# Patient Record
Sex: Female | Born: 1945 | Race: White | Hispanic: No | Marital: Married | Smoking: Former smoker
Health system: Southern US, Community
[De-identification: ages and names within clinical notes are randomized; demographics above are authoritative.]

## PROBLEM LIST (undated history)

## (undated) DIAGNOSIS — C439 Malignant melanoma of skin, unspecified: Secondary | ICD-10-CM

## (undated) DIAGNOSIS — M8580 Other specified disorders of bone density and structure, unspecified site: Secondary | ICD-10-CM

## (undated) DIAGNOSIS — G4700 Insomnia, unspecified: Secondary | ICD-10-CM

## (undated) DIAGNOSIS — M329 Systemic lupus erythematosus, unspecified: Secondary | ICD-10-CM

## (undated) DIAGNOSIS — Z8719 Personal history of other diseases of the digestive system: Secondary | ICD-10-CM

## (undated) DIAGNOSIS — Z9889 Other specified postprocedural states: Secondary | ICD-10-CM

## (undated) DIAGNOSIS — K219 Gastro-esophageal reflux disease without esophagitis: Secondary | ICD-10-CM

## (undated) DIAGNOSIS — M1990 Unspecified osteoarthritis, unspecified site: Secondary | ICD-10-CM

## (undated) DIAGNOSIS — I341 Nonrheumatic mitral (valve) prolapse: Secondary | ICD-10-CM

## (undated) DIAGNOSIS — E119 Type 2 diabetes mellitus without complications: Secondary | ICD-10-CM

## (undated) DIAGNOSIS — R112 Nausea with vomiting, unspecified: Secondary | ICD-10-CM

## (undated) DIAGNOSIS — I10 Essential (primary) hypertension: Secondary | ICD-10-CM

## (undated) DIAGNOSIS — Z8489 Family history of other specified conditions: Secondary | ICD-10-CM

## (undated) HISTORY — PX: ABDOMINAL HYSTERECTOMY: SHX81

## (undated) HISTORY — PX: TOTAL SHOULDER ARTHROPLASTY: SHX126

## (undated) HISTORY — DX: Insomnia, unspecified: G47.00

## (undated) HISTORY — DX: Other specified disorders of bone density and structure, unspecified site: M85.80

## (undated) HISTORY — PX: OTHER SURGICAL HISTORY: SHX169

## (undated) HISTORY — DX: Malignant melanoma of skin, unspecified: C43.9

## (undated) HISTORY — DX: Systemic lupus erythematosus, unspecified: M32.9

## (undated) HISTORY — PX: BREAST SURGERY: SHX581

---

## 1998-12-06 ENCOUNTER — Encounter: Payer: Self-pay | Admitting: Sports Medicine

## 1998-12-06 ENCOUNTER — Ambulatory Visit (HOSPITAL_COMMUNITY): Admission: RE | Admit: 1998-12-06 | Discharge: 1998-12-06 | Payer: Self-pay | Admitting: Sports Medicine

## 2015-03-13 DIAGNOSIS — G4700 Insomnia, unspecified: Secondary | ICD-10-CM | POA: Insufficient documentation

## 2015-03-13 DIAGNOSIS — E119 Type 2 diabetes mellitus without complications: Secondary | ICD-10-CM | POA: Insufficient documentation

## 2015-03-13 DIAGNOSIS — F329 Major depressive disorder, single episode, unspecified: Secondary | ICD-10-CM | POA: Insufficient documentation

## 2015-04-02 DIAGNOSIS — F3342 Major depressive disorder, recurrent, in full remission: Secondary | ICD-10-CM | POA: Insufficient documentation

## 2015-11-07 NOTE — H&P (Signed)
TOTAL KNEE ADMISSION H&P  Patient is being admitted for right total knee arthroplasty.  Subjective:  Chief Complaint:      Right knee primary OA / pain  HPI: Maria Brennan, 70 y.o. female, has a history of pain and functional disability in the right knee due to arthritis and has failed non-surgical conservative treatments for greater than 12 weeks to includeNSAID's and/or analgesics, corticosteriod injections and activity modification.  Onset of symptoms was gradual, starting 2-3 years ago with gradually worsening course since that time. The patient noted no past surgery on the right knee(s).  Patient currently rates pain in the right knee(s) at 8 out of 10 with activity. Patient has worsening of pain with activity and weight bearing, pain that interferes with activities of daily living, pain with passive range of motion, crepitus and joint swelling.  Patient has evidence of periarticular osteophytes and joint space narrowing by imaging studies.  There is no active infection.   Risks, benefits and expectations were discussed with the patient.  Risks including but not limited to the risk of anesthesia, blood clots, nerve damage, blood vessel damage, failure of the prosthesis, infection and up to and including death.  Patient understand the risks, benefits and expectations and wishes to proceed with surgery.   PCP: Maria Channel, MD  D/C Plans:      Home  Post-op Meds:       No Rx given  Tranexamic Acid:      To be given - IV   Decadron:      Is to be given  FYI:     ASA  Norco      Past Medical History:  Diagnosis Date  . Arthritis    oa  . Diabetes mellitus without complication (Maria Brennan)   . Family history of adverse reaction to anesthesia    mother and sister slow to awaken   . GERD (gastroesophageal reflux disease)   . History of hiatal hernia   . Hypertension   . MVP (mitral valve prolapse)   . PONV (postoperative nausea and vomiting) yrs ago    Past Surgical History:  Procedure  Laterality Date  . ABDOMINAL HYSTERECTOMY     partial  . BREAST SURGERY Bilateral    implants  . rotator  cuff Right     No prescriptions prior to admission.   Allergies  Allergen Reactions  . Codeine Itching and Nausea Only    Social History  Substance Use Topics  . Smoking status: Current Every Day Smoker    Packs/day: 0.50    Years: 53.00    Types: Cigarettes  . Smokeless tobacco: Never Used  . Alcohol use No       Review of Systems  Constitutional: Positive for malaise/fatigue.  HENT: Positive for tinnitus.   Eyes: Negative.   Respiratory: Negative.   Cardiovascular: Negative.   Gastrointestinal: Positive for constipation and heartburn.  Genitourinary: Positive for frequency and urgency.  Musculoskeletal: Positive for back pain and joint pain.  Skin: Negative.   Neurological: Negative.   Endo/Heme/Allergies: Negative.   Psychiatric/Behavioral: The patient is nervous/anxious and has insomnia.     Objective:  Physical Exam  Constitutional: She is oriented to person, place, and time. She appears well-developed.  HENT:  Head: Normocephalic.  Eyes: Pupils are equal, round, and reactive to light.  Neck: Neck supple. No JVD present. No tracheal deviation present. No thyromegaly present.  Cardiovascular: Normal rate, regular rhythm, normal heart sounds and intact distal pulses.   Respiratory: Effort normal  and breath sounds normal. No respiratory distress. She has no wheezes.  GI: Soft. There is no tenderness. There is no guarding.  Musculoskeletal:       Right knee: She exhibits decreased range of motion, swelling and bony tenderness. She exhibits no ecchymosis, no deformity, no laceration, no erythema and normal alignment. Tenderness found.  Lymphadenopathy:    She has no cervical adenopathy.  Neurological: She is alert and oriented to person, place, and time.  Skin: Skin is warm and dry.  Psychiatric: She has a normal mood and affect.      Imaging  Review Plain radiographs demonstrate severe degenerative joint disease of the right knee(s). The overall alignment is neutral. The bone quality appears to be good for age and reported activity level.  Assessment/Plan:  End stage arthritis, right knee   The patient history, physical examination, clinical judgment of the provider and imaging studies are consistent with end stage degenerative joint disease of the right knee(s) and total knee arthroplasty is deemed medically necessary. The treatment options including medical management, injection therapy arthroscopy and arthroplasty were discussed at length. The risks and benefits of total knee arthroplasty were presented and reviewed. The risks due to aseptic loosening, infection, stiffness, patella tracking problems, thromboembolic complications and other imponderables were discussed. The patient acknowledged the explanation, agreed to proceed with the plan and consent was signed. Patient is being admitted for inpatient treatment for surgery, pain control, PT, OT, prophylactic antibiotics, VTE prophylaxis, progressive ambulation and ADL's and discharge planning. The patient is planning to be discharged home.      Maria Pugh Javious Hallisey   PA-C  11/15/2015, 2:40 PM

## 2015-11-08 ENCOUNTER — Other Ambulatory Visit (HOSPITAL_COMMUNITY): Payer: Self-pay | Admitting: *Deleted

## 2015-11-08 NOTE — Patient Instructions (Addendum)
Maria Brennan  11/08/2015   Your procedure is scheduled on: 11-20-15  Report to [facility] Main  Entrance take [facility]  elevators to 3rd floor to  [place] at 705 AM.  Call this number if you have problems the morning of surgery [phone]   Remember: ONLY 1 PERSON MAY GO WITH YOU TO SHORT STAY TO GET  READY MORNING OF [place].  Do not eat food or drink liquids :After Midnight.     Take these medicines the morning of surgery with A SIP OF WATER: duloxetine (cymbalta) DO NOT TAKE ANY DIABETIC MEDICATIONS DAY OF YOUR SURGERY                               You may not have any metal on your body including hair pins and              piercings  Do not wear jewelry, make-up, lotions, powders or perfumes, deodorant             Do not wear nail polish.  Do not shave  48 hours prior to surgery.              Men may shave face and neck.   Do not bring valuables to the hospital. [place].  Contacts, dentures or bridgework may not be worn into surgery.  Leave suitcase in the car. After surgery it may be brought to your room.               Please read over the following fact sheets you were given: _____________________________________________________________________             [facility] - Preparing for Surgery Before surgery, you can play an important role.  Because skin is not sterile, your skin needs to be as free of germs as possible.  You can reduce the number of germs on your skin by washing with CHG (chlorahexidine gluconate) soap before surgery.  CHG is an antiseptic cleaner which kills germs and bonds with the skin to continue killing germs even after washing. Please DO NOT use if you have an allergy to CHG or antibacterial soaps.  If your skin becomes reddened/irritated stop using the CHG and inform your nurse when you arrive at Short Stay. Do not shave (including legs and underarms) for at least 48  hours prior to the first CHG shower.  You may shave your face/neck. Please follow these instructions carefully:  1.  Shower with CHG Soap the night before surgery and the  morning of Surgery.  2.  If you choose to wash your hair, wash your hair first as usual with your  normal  shampoo.  3.  After you shampoo, rinse your hair and body thoroughly to remove the  shampoo.                           4.  Use CHG as you would any other liquid soap.  You can apply chg directly  to the skin and wash                       Gently with a scrungie or clean washcloth.  5.  Apply the CHG Soap to your body ONLY FROM THE NECK DOWN.   Do not use on face/ open                           Wound or open sores. Avoid contact with eyes, ears mouth and genitals (private parts).                       Wash face,  Genitals (private parts) with your normal soap.             6.  Wash thoroughly, paying special attention to the area where your surgery  will be performed.  7.  Thoroughly rinse your body with warm water from the neck down.  8.  DO NOT shower/wash with your normal soap after using and rinsing off  the CHG Soap.                9.  Pat yourself dry with a clean towel.            10.  Wear clean pajamas.            11.  Place clean sheets on your bed the night of your first shower and do not  sleep with pets. Day of Surgery : Do not apply any lotions/deodorants the morning of surgery.  Please wear clean clothes to the hospital/surgery center.  FAILURE TO FOLLOW THESE INSTRUCTIONS MAY RESULT IN THE CANCELLATION OF YOUR SURGERY PATIENT SIGNATURE_________________________________  NURSE SIGNATURE__________________________________  ________________________________________________________________________   Maria Brennan  An incentive spirometer is a tool that can help keep your lungs clear and active. This tool measures how well you are filling your lungs with each breath. Taking long deep breaths may help  reverse or decrease the chance of developing breathing (pulmonary) problems (especially infection) following:  A long period of time when you are unable to move or be active. BEFORE THE PROCEDURE   If the spirometer includes an indicator to show your best effort, your nurse or respiratory therapist will set it to a desired goal.  If possible, sit up straight or lean slightly forward. Try not to slouch.  Hold the incentive spirometer in an upright position. INSTRUCTIONS FOR USE  1. Sit on the edge of your bed if possible, or sit up as far as you can in bed or on a chair. 2. Hold the incentive spirometer in an upright position. 3. Breathe out normally. 4. Place the mouthpiece in your mouth and seal your lips tightly around it. 5. Breathe in slowly and as deeply as possible, raising the piston or the ball toward the top of the column. 6. Hold your breath for 3-5 seconds or for as long as possible. Allow the piston or ball to fall to the bottom of the column. 7. Remove the mouthpiece from your mouth and breathe out normally. 8. Rest for a few seconds and repeat Steps 1 through 7 at least 10 times every 1-2 hours when you are awake. Take your time and take a few normal breaths between deep breaths. 9. The spirometer may include an indicator to show your best effort. Use the indicator as a goal to work toward during each repetition. 10. After each set of 10 deep breaths, practice coughing to be sure your lungs are clear. If you have an incision (the cut made at the time of surgery), support your incision when coughing by placing a  pillow or rolled up towels firmly against it. Once you are able to get out of bed, walk around indoors and cough well. You may stop using the incentive spirometer when instructed by your caregiver.  RISKS AND COMPLICATIONS  Take your time so you do not get dizzy or light-headed.  If you are in pain, you may need to take or ask for pain medication before doing incentive  spirometry. It is harder to take a deep breath if you are having pain. AFTER USE  Rest and breathe slowly and easily.  It can be helpful to keep track of a log of your progress. Your caregiver can provide you with a simple table to help with this. If you are using the spirometer at home, follow these instructions: [place] IF:   You are having difficultly using the spirometer.  You have trouble using the spirometer as often as instructed.  Your pain medication is not giving enough relief while using the spirometer.  You develop fever of 100.5 F (38.1 C) or higher. SEEK IMMEDIATE MEDICAL CARE IF:   You cough up bloody sputum that had not been present before.  You develop fever of 102 F (38.9 C) or greater.  You develop worsening pain at or near the incision site. MAKE SURE YOU:   Understand these instructions.  Will watch your condition.  Will get help right away if you are not doing well or get worse. Document Released: 07/07/2006 Document Revised: 05/19/2011 Document Reviewed: 09/07/2006 Exit[employer] Patient Information 2014 [employer], [place].   ________________________________________________________________________  WHAT IS A BLOOD TRANSFUSION? Blood Transfusion Information  A transfusion is the replacement of blood or some of its parts. Blood is made up of multiple cells which provide different functions.  Red blood cells carry oxygen and are used for blood loss replacement.  White blood cells fight against infection.  Platelets control bleeding.  Plasma helps clot blood.  Other blood products are available for specialized needs, such as hemophilia or other clotting disorders. BEFORE THE TRANSFUSION  Who gives blood for transfusions?   Healthy volunteers who are fully evaluated to make sure their blood is safe. This is blood bank blood. Transfusion therapy is the safest it has ever been in the practice of medicine. Before blood is taken from a donor, a  complete history is taken to make sure that person has no history of diseases nor engages in risky social behavior (examples are intravenous drug use or sexual activity with multiple partners). The donor's travel history is screened to minimize risk of transmitting infections, such as malaria. The donated blood is tested for signs of infectious diseases, such as HIV and hepatitis. The blood is then tested to be sure it is compatible with you in order to minimize the chance of a transfusion reaction. If you or a relative donates blood, this is often done in anticipation of surgery and is not appropriate for emergency situations. It takes many days to process the donated blood. RISKS AND COMPLICATIONS Although transfusion therapy is very safe and saves many lives, the main dangers of transfusion include:   Getting an infectious disease.  Developing a transfusion reaction. This is an allergic reaction to something in the blood you were given. Every precaution is taken to prevent this. The decision to have a blood transfusion has been considered carefully by your caregiver before blood is given. Blood is not given unless the benefits outweigh the risks. AFTER THE TRANSFUSION  Right after receiving a blood transfusion, you  will usually feel much better and more energetic. This is especially true if your red blood cells have gotten low (anemic). The transfusion raises the level of the red blood cells which carry oxygen, and this usually causes an energy increase.  The nurse administering the transfusion will monitor you carefully for complications. HOME CARE INSTRUCTIONS  No special instructions are needed after a transfusion. You may find your energy is better. Speak with your caregiver about any limitations on activity for underlying diseases you may have. SEEK MEDICAL CARE IF:   Your condition is not improving after your transfusion.  You develop redness or irritation at the intravenous (IV)  site. SEEK IMMEDIATE MEDICAL CARE IF:  Any of the following symptoms occur over the next 12 hours:  Shaking chills.  You have a temperature by mouth above 102 F (38.9 C), not controlled by medicine.  Chest, back, or muscle pain.  People around you feel you are not acting correctly or are confused.  Shortness of breath or difficulty breathing.  Dizziness and fainting.  You get a rash or develop hives.  You have a decrease in urine output.  Your urine turns a dark color or changes to pink, red, or brown. Any of the following symptoms occur over the next 10 days:  You have a temperature by mouth above 102 F (38.9 C), not controlled by medicine.  Shortness of breath.  Weakness after normal activity.  The white part of the eye turns yellow (jaundice).  You have a decrease in the amount of urine or are urinating less often.  Your urine turns a dark color or changes to pink, red, or brown. Document Released: 02/22/2000 Document Revised: 05/19/2011 Document Reviewed: 10/11/2007 [facility] Patient Information 2014 [place], [place].  _______________________________________________________________________

## 2015-11-08 NOTE — Progress Notes (Signed)
ekg 10-21-12 dr Raeanne Gathers on chart  medical clearance dr Raeanne Gathers on chart for 11-20-15 surgeeyr

## 2015-11-09 ENCOUNTER — Encounter (HOSPITAL_COMMUNITY): Payer: Self-pay | Admitting: *Deleted

## 2015-11-09 ENCOUNTER — Encounter (HOSPITAL_COMMUNITY)
Admission: RE | Admit: 2015-11-09 | Discharge: 2015-11-09 | Disposition: A | Discharge status: Home / Self Care | Payer: Medicare Other | Source: Ambulatory Visit | Attending: Orthopedic Surgery | Admitting: Orthopedic Surgery

## 2015-11-09 DIAGNOSIS — Z01812 Encounter for preprocedural laboratory examination: Secondary | ICD-10-CM | POA: Insufficient documentation

## 2015-11-09 HISTORY — DX: Other specified postprocedural states: Z98.890

## 2015-11-09 HISTORY — DX: Personal history of other diseases of the digestive system: Z87.19

## 2015-11-09 HISTORY — DX: Family history of other specified conditions: Z84.89

## 2015-11-09 HISTORY — DX: Gastro-esophageal reflux disease without esophagitis: K21.9

## 2015-11-09 HISTORY — DX: Nonrheumatic mitral (valve) prolapse: I34.1

## 2015-11-09 HISTORY — DX: Type 2 diabetes mellitus without complications: E11.9

## 2015-11-09 HISTORY — DX: Essential (primary) hypertension: I10

## 2015-11-09 HISTORY — DX: Other specified postprocedural states: R11.2

## 2015-11-09 HISTORY — DX: Unspecified osteoarthritis, unspecified site: M19.90

## 2015-11-09 LAB — BASIC METABOLIC PANEL
Anion gap: 7 (ref 5–15)
BUN: 14 mg/dL (ref 6–20)
CALCIUM: 9.6 mg/dL (ref 8.9–10.3)
CHLORIDE: 105 mmol/L (ref 101–111)
CO2: 26 mmol/L (ref 22–32)
CREATININE: 0.85 mg/dL (ref 0.44–1.00)
Glucose, Bld: 81 mg/dL (ref 65–99)
Potassium: 4.1 mmol/L (ref 3.5–5.1)
SODIUM: 138 mmol/L (ref 135–145)

## 2015-11-09 LAB — CBC
HCT: 35.1 % — ABNORMAL LOW (ref 36.0–46.0)
Hemoglobin: 11.7 g/dL — ABNORMAL LOW (ref 12.0–15.0)
MCH: 30.8 pg (ref 26.0–34.0)
MCHC: 33.3 g/dL (ref 30.0–36.0)
MCV: 92.4 fL (ref 78.0–100.0)
PLATELETS: 324 10*3/uL (ref 150–400)
RBC: 3.8 MIL/uL — AB (ref 3.87–5.11)
RDW: 13.2 % (ref 11.5–15.5)
WBC: 4.5 10*3/uL (ref 4.0–10.5)

## 2015-11-09 LAB — SURGICAL PCR SCREEN
MRSA, PCR: NEGATIVE
STAPHYLOCOCCUS AUREUS: NEGATIVE

## 2015-11-10 LAB — HEMOGLOBIN A1C
Hgb A1c MFr Bld: 6 % — ABNORMAL HIGH (ref 4.8–5.6)
MEAN PLASMA GLUCOSE: 126 mg/dL

## 2015-11-14 ENCOUNTER — Other Ambulatory Visit (HOSPITAL_COMMUNITY): Payer: Self-pay

## 2015-11-19 NOTE — Anesthesia Preprocedure Evaluation (Addendum)
Anesthesia Evaluation  Patient identified by MRN, date of birth, ID band Patient awake    Reviewed: Allergy & Precautions, NPO status , Patient's Chart, lab work & pertinent test results  History of Anesthesia Complications (+) PONV, Family history of anesthesia reaction and history of anesthetic complications (mother and sister slow to awaken)  Airway Mallampati: II  TM Distance: >3 FB Neck ROM: Full    Dental  (+) Dental Advisory Given,    Pulmonary neg shortness of breath, sleep apnea (symptoms suggestive) , neg COPD, Current Smoker,    Pulmonary exam normal breath sounds clear to auscultation       Cardiovascular hypertension, Pt. on medications (-) angina(-) Past MI, (-) Cardiac Stents and (-) CABG + Valvular Problems/Murmurs MVP  Rhythm:Regular Rate:Normal     Neuro/Psych neg Seizures negative neurological ROS     GI/Hepatic Neg liver ROS, hiatal hernia, GERD  Medicated and Controlled,  Endo/Other  diabetes, Well Controlled, Type 2, Oral Hypoglycemic Agents  Renal/GU negative Renal ROS     Musculoskeletal  (+) Arthritis ,   Abdominal   Peds  Hematology negative hematology ROS (+)   Anesthesia Other Findings HLD  Reproductive/Obstetrics                            Anesthesia Physical Anesthesia Plan  ASA: III  Anesthesia Plan: Spinal   Post-op Pain Management:    Induction: Intravenous  Airway Management Planned: Natural Airway and Simple Face Mask  Additional Equipment:   Intra-op Plan:   Post-operative Plan: Extubation in OR  Informed Consent: I have reviewed the patients History and Physical, chart, labs and discussed the procedure including the risks, benefits and alternatives for the proposed anesthesia with the patient or authorized representative who has indicated his/her understanding and acceptance.   Dental advisory given  Plan Discussed with:  CRNA  Anesthesia Plan Comments: (I have discussed risks of neuraxial anesthesia including but not limited to infection, bleeding, nerve injury, back pain, headache, seizures, and failure of block. Patient denies bleeding disorders and is not currently anticoagulated. Labs have been reviewed. Risks and benefits discussed. All patient's questions answered.   Platelets 324)       Anesthesia Quick Evaluation

## 2015-11-20 ENCOUNTER — Inpatient Hospital Stay (HOSPITAL_COMMUNITY): Payer: Medicare Other | Admitting: Anesthesiology

## 2015-11-20 ENCOUNTER — Encounter (HOSPITAL_COMMUNITY)
Admission: RE | Disposition: A | Discharge status: Home Health | Payer: Self-pay | Source: Ambulatory Visit | Attending: Orthopedic Surgery

## 2015-11-20 ENCOUNTER — Inpatient Hospital Stay (HOSPITAL_COMMUNITY)
Admission: RE | Admit: 2015-11-20 | Discharge: 2015-11-21 | DRG: 470 | Disposition: A | Discharge status: Home Health | Payer: Medicare Other | Source: Ambulatory Visit | Attending: Orthopedic Surgery | Admitting: Orthopedic Surgery

## 2015-11-20 ENCOUNTER — Encounter (HOSPITAL_COMMUNITY): Payer: Self-pay | Admitting: *Deleted

## 2015-11-20 DIAGNOSIS — Z7982 Long term (current) use of aspirin: Secondary | ICD-10-CM

## 2015-11-20 DIAGNOSIS — I10 Essential (primary) hypertension: Secondary | ICD-10-CM | POA: Diagnosis not present

## 2015-11-20 DIAGNOSIS — K219 Gastro-esophageal reflux disease without esophagitis: Secondary | ICD-10-CM | POA: Diagnosis present

## 2015-11-20 DIAGNOSIS — M1711 Unilateral primary osteoarthritis, right knee: Principal | ICD-10-CM | POA: Diagnosis present

## 2015-11-20 DIAGNOSIS — F17210 Nicotine dependence, cigarettes, uncomplicated: Secondary | ICD-10-CM | POA: Diagnosis not present

## 2015-11-20 DIAGNOSIS — E663 Overweight: Secondary | ICD-10-CM | POA: Diagnosis present

## 2015-11-20 DIAGNOSIS — Z6826 Body mass index (BMI) 26.0-26.9, adult: Secondary | ICD-10-CM

## 2015-11-20 DIAGNOSIS — Z96651 Presence of right artificial knee joint: Secondary | ICD-10-CM

## 2015-11-20 DIAGNOSIS — E119 Type 2 diabetes mellitus without complications: Secondary | ICD-10-CM | POA: Diagnosis present

## 2015-11-20 DIAGNOSIS — M659 Synovitis and tenosynovitis, unspecified: Secondary | ICD-10-CM | POA: Diagnosis present

## 2015-11-20 DIAGNOSIS — M25561 Pain in right knee: Secondary | ICD-10-CM | POA: Diagnosis present

## 2015-11-20 DIAGNOSIS — K449 Diaphragmatic hernia without obstruction or gangrene: Secondary | ICD-10-CM | POA: Diagnosis not present

## 2015-11-20 DIAGNOSIS — Z96659 Presence of unspecified artificial knee joint: Secondary | ICD-10-CM

## 2015-11-20 HISTORY — PX: TOTAL KNEE ARTHROPLASTY: SHX125

## 2015-11-20 LAB — GLUCOSE, CAPILLARY
GLUCOSE-CAPILLARY: 116 mg/dL — AB (ref 65–99)
GLUCOSE-CAPILLARY: 205 mg/dL — AB (ref 65–99)
Glucose-Capillary: 114 mg/dL — ABNORMAL HIGH (ref 65–99)
Glucose-Capillary: 146 mg/dL — ABNORMAL HIGH (ref 65–99)

## 2015-11-20 LAB — ABO/RH: ABO/RH(D): A POS

## 2015-11-20 LAB — TYPE AND SCREEN
ABO/RH(D): A POS
Antibody Screen: NEGATIVE

## 2015-11-20 SURGERY — ARTHROPLASTY, KNEE, TOTAL
Anesthesia: Spinal | Site: Knee | Laterality: Right

## 2015-11-20 MED ORDER — CELECOXIB 200 MG PO CAPS
200.0000 mg | ORAL_CAPSULE | Freq: Two times a day (BID) | ORAL | Status: DC
  Administered 2015-11-20 – 2015-11-21 (×2): 200 mg via ORAL
  Filled 2015-11-20 (×2): qty 1

## 2015-11-20 MED ORDER — DULOXETINE HCL 30 MG PO CPEP
30.0000 mg | ORAL_CAPSULE | Freq: Two times a day (BID) | ORAL | Status: DC
  Administered 2015-11-20 – 2015-11-21 (×2): 30 mg via ORAL
  Filled 2015-11-20 (×2): qty 1

## 2015-11-20 MED ORDER — PROPOFOL 10 MG/ML IV BOLUS
INTRAVENOUS | Status: AC
  Filled 2015-11-20: qty 20

## 2015-11-20 MED ORDER — DIPHENHYDRAMINE HCL 25 MG PO CAPS: 25.0000 mg | ORAL_CAPSULE | Freq: Four times a day (QID) | ORAL | Status: DC | PRN

## 2015-11-20 MED ORDER — METFORMIN HCL 500 MG PO TABS
500.0000 mg | ORAL_TABLET | Freq: Every day | ORAL | Status: DC
  Administered 2015-11-20: 500 mg via ORAL
  Filled 2015-11-20: qty 1

## 2015-11-20 MED ORDER — MIDAZOLAM HCL 5 MG/5ML IJ SOLN
INTRAMUSCULAR | Status: DC | PRN
  Administered 2015-11-20: 2 mg via INTRAVENOUS

## 2015-11-20 MED ORDER — CEFAZOLIN SODIUM-DEXTROSE 2-4 GM/100ML-% IV SOLN
2.0000 g | INTRAVENOUS | Status: AC
  Administered 2015-11-20: 2 g via INTRAVENOUS
  Filled 2015-11-20: qty 100

## 2015-11-20 MED ORDER — BUPIVACAINE HCL 0.25 % IJ SOLN
INTRAMUSCULAR | Status: DC | PRN
  Administered 2015-11-20: 30 mL

## 2015-11-20 MED ORDER — HYDROMORPHONE HCL 1 MG/ML IJ SOLN
0.5000 mg | INTRAMUSCULAR | Status: DC | PRN
  Administered 2015-11-20: 0.5 mg via INTRAVENOUS
  Administered 2015-11-20: 1 mg via INTRAVENOUS
  Filled 2015-11-20 (×2): qty 1

## 2015-11-20 MED ORDER — DEXAMETHASONE SODIUM PHOSPHATE 10 MG/ML IJ SOLN
10.0000 mg | Freq: Once | INTRAMUSCULAR | Status: DC
  Filled 2015-11-20: qty 1

## 2015-11-20 MED ORDER — HYDROXYZINE HCL 25 MG PO TABS
25.0000 mg | ORAL_TABLET | Freq: Every day | ORAL | Status: DC
  Administered 2015-11-20: 25 mg via ORAL
  Filled 2015-11-20: qty 1

## 2015-11-20 MED ORDER — BUPIVACAINE HCL (PF) 0.25 % IJ SOLN
INTRAMUSCULAR | Status: AC
  Filled 2015-11-20: qty 30

## 2015-11-20 MED ORDER — PHENYLEPHRINE HCL 10 MG/ML IJ SOLN
INTRAMUSCULAR | Status: DC | PRN
  Administered 2015-11-20: 10 ug/min via INTRAVENOUS

## 2015-11-20 MED ORDER — POLYETHYLENE GLYCOL 3350 17 G PO PACK
17.0000 g | PACK | Freq: Two times a day (BID) | ORAL | Status: DC
  Administered 2015-11-20 – 2015-11-21 (×2): 17 g via ORAL
  Filled 2015-11-20 (×2): qty 1

## 2015-11-20 MED ORDER — ASPIRIN 81 MG PO CHEW: 81.0000 mg | CHEWABLE_TABLET | Freq: Two times a day (BID) | ORAL | 0 refills | Status: DC

## 2015-11-20 MED ORDER — CEFAZOLIN SODIUM-DEXTROSE 2-4 GM/100ML-% IV SOLN
INTRAVENOUS | Status: AC
  Filled 2015-11-20: qty 100

## 2015-11-20 MED ORDER — LIRAGLUTIDE 18 MG/3ML ~~LOC~~ SOPN: 1.8000 mg | PEN_INJECTOR | Freq: Every day | SUBCUTANEOUS | Status: DC

## 2015-11-20 MED ORDER — DOCUSATE SODIUM 100 MG PO CAPS: 100.0000 mg | ORAL_CAPSULE | Freq: Two times a day (BID) | ORAL | 0 refills | Status: DC

## 2015-11-20 MED ORDER — TIZANIDINE HCL 4 MG PO TABS: 4.0000 mg | ORAL_TABLET | Freq: Four times a day (QID) | ORAL | 0 refills | Status: DC | PRN

## 2015-11-20 MED ORDER — HYDROCODONE-ACETAMINOPHEN 7.5-325 MG PO TABS: 1.0000 | ORAL_TABLET | ORAL | 0 refills | Status: DC | PRN

## 2015-11-20 MED ORDER — METOCLOPRAMIDE HCL 5 MG PO TABS: 5.0000 mg | ORAL_TABLET | Freq: Three times a day (TID) | ORAL | Status: DC | PRN

## 2015-11-20 MED ORDER — PHENYLEPHRINE HCL 10 MG/ML IJ SOLN
INTRAMUSCULAR | Status: AC
  Filled 2015-11-20: qty 1

## 2015-11-20 MED ORDER — ATORVASTATIN CALCIUM 20 MG PO TABS
40.0000 mg | ORAL_TABLET | Freq: Every evening | ORAL | Status: DC
  Administered 2015-11-20: 40 mg via ORAL
  Filled 2015-11-20: qty 2

## 2015-11-20 MED ORDER — BUPIVACAINE IN DEXTROSE 0.75-8.25 % IT SOLN
INTRATHECAL | Status: DC | PRN
  Administered 2015-11-20: 2 mL via INTRATHECAL

## 2015-11-20 MED ORDER — 0.9 % SODIUM CHLORIDE (POUR BTL) OPTIME
TOPICAL | Status: DC | PRN
  Administered 2015-11-20: 1000 mL

## 2015-11-20 MED ORDER — DEXAMETHASONE SODIUM PHOSPHATE 10 MG/ML IJ SOLN
10.0000 mg | Freq: Once | INTRAMUSCULAR | Status: AC
Start: 2015-11-20 — End: 2015-11-20
  Administered 2015-11-20: 10 mg via INTRAVENOUS

## 2015-11-20 MED ORDER — KETOROLAC TROMETHAMINE 30 MG/ML IJ SOLN
INTRAMUSCULAR | Status: DC | PRN
  Administered 2015-11-20: 30 mg

## 2015-11-20 MED ORDER — METHOCARBAMOL 1000 MG/10ML IJ SOLN
500.0000 mg | Freq: Four times a day (QID) | INTRAVENOUS | Status: DC | PRN
  Administered 2015-11-20: 500 mg via INTRAVENOUS
  Filled 2015-11-20: qty 5
  Filled 2015-11-20: qty 550

## 2015-11-20 MED ORDER — LACTATED RINGERS IV SOLN
INTRAVENOUS | Status: DC
  Administered 2015-11-20 (×2): via INTRAVENOUS

## 2015-11-20 MED ORDER — CELECOXIB 200 MG PO CAPS: 200.0000 mg | ORAL_CAPSULE | Freq: Two times a day (BID) | ORAL | 0 refills | Status: DC

## 2015-11-20 MED ORDER — SODIUM CHLORIDE 0.9 % IV SOLN
INTRAVENOUS | Status: DC
  Administered 2015-11-20 – 2015-11-21 (×2): via INTRAVENOUS
  Filled 2015-11-20 (×5): qty 1000

## 2015-11-20 MED ORDER — METOCLOPRAMIDE HCL 5 MG/ML IJ SOLN: 5.0000 mg | Freq: Three times a day (TID) | INTRAMUSCULAR | Status: DC | PRN

## 2015-11-20 MED ORDER — FENTANYL CITRATE (PF) 100 MCG/2ML IJ SOLN: 25.0000 ug | INTRAMUSCULAR | Status: DC | PRN

## 2015-11-20 MED ORDER — SODIUM CHLORIDE 0.9 % IJ SOLN
INTRAMUSCULAR | Status: DC | PRN
  Administered 2015-11-20: 30 mL

## 2015-11-20 MED ORDER — IRBESARTAN 150 MG PO TABS
150.0000 mg | ORAL_TABLET | Freq: Every day | ORAL | Status: DC
  Administered 2015-11-20 – 2015-11-21 (×2): 150 mg via ORAL
  Filled 2015-11-20 (×2): qty 1

## 2015-11-20 MED ORDER — MENTHOL 3 MG MT LOZG: 1.0000 | LOZENGE | OROMUCOSAL | Status: DC | PRN

## 2015-11-20 MED ORDER — LIDOCAINE 2% (20 MG/ML) 5 ML SYRINGE
INTRAMUSCULAR | Status: AC
  Filled 2015-11-20: qty 5

## 2015-11-20 MED ORDER — FERROUS SULFATE 325 (65 FE) MG PO TABS: 325.0000 mg | ORAL_TABLET | Freq: Three times a day (TID) | ORAL | Status: DC

## 2015-11-20 MED ORDER — PROPOFOL 10 MG/ML IV BOLUS
INTRAVENOUS | Status: AC
  Filled 2015-11-20: qty 40

## 2015-11-20 MED ORDER — TRANEXAMIC ACID 1000 MG/10ML IV SOLN
1000.0000 mg | INTRAVENOUS | Status: AC
  Administered 2015-11-20: 1000 mg via INTRAVENOUS
  Filled 2015-11-20: qty 1100

## 2015-11-20 MED ORDER — SODIUM CHLORIDE 0.9 % IR SOLN
Status: DC | PRN
  Administered 2015-11-20: 1000 mL

## 2015-11-20 MED ORDER — ONDANSETRON HCL 4 MG/2ML IJ SOLN
4.0000 mg | Freq: Four times a day (QID) | INTRAMUSCULAR | Status: DC | PRN
Start: 2015-11-20 — End: 2015-11-21

## 2015-11-20 MED ORDER — ONDANSETRON HCL 4 MG/2ML IJ SOLN
INTRAMUSCULAR | Status: AC
  Filled 2015-11-20: qty 2

## 2015-11-20 MED ORDER — MIDAZOLAM HCL 2 MG/2ML IJ SOLN
INTRAMUSCULAR | Status: AC
  Filled 2015-11-20: qty 2

## 2015-11-20 MED ORDER — PANTOPRAZOLE SODIUM 40 MG PO TBEC
80.0000 mg | DELAYED_RELEASE_TABLET | Freq: Every day | ORAL | Status: DC
  Administered 2015-11-20 – 2015-11-21 (×2): 80 mg via ORAL
  Filled 2015-11-20 (×2): qty 2

## 2015-11-20 MED ORDER — STERILE WATER FOR IRRIGATION IR SOLN
Status: DC | PRN
  Administered 2015-11-20: 3000 mL

## 2015-11-20 MED ORDER — PROPOFOL 500 MG/50ML IV EMUL
INTRAVENOUS | Status: DC | PRN
  Administered 2015-11-20: 75 ug/kg/min via INTRAVENOUS

## 2015-11-20 MED ORDER — ZOLPIDEM TARTRATE 5 MG PO TABS
5.0000 mg | ORAL_TABLET | Freq: Every day | ORAL | Status: DC
  Administered 2015-11-20: 5 mg via ORAL
  Filled 2015-11-20: qty 1

## 2015-11-20 MED ORDER — TRIAMTERENE-HCTZ 37.5-25 MG PO TABS
0.5000 | ORAL_TABLET | Freq: Every day | ORAL | Status: DC
  Administered 2015-11-20 – 2015-11-21 (×2): 0.5 via ORAL
  Filled 2015-11-20 (×2): qty 0.5

## 2015-11-20 MED ORDER — ONDANSETRON HCL 4 MG PO TABS: 4.0000 mg | ORAL_TABLET | Freq: Four times a day (QID) | ORAL | Status: DC | PRN

## 2015-11-20 MED ORDER — CEFAZOLIN SODIUM-DEXTROSE 2-4 GM/100ML-% IV SOLN
2.0000 g | Freq: Four times a day (QID) | INTRAVENOUS | Status: AC
  Administered 2015-11-20 (×2): 2 g via INTRAVENOUS
  Filled 2015-11-20 (×2): qty 100

## 2015-11-20 MED ORDER — PHENOL 1.4 % MT LIQD: 1.0000 | OROMUCOSAL | Status: DC | PRN

## 2015-11-20 MED ORDER — PROMETHAZINE HCL 25 MG/ML IJ SOLN: 6.2500 mg | INTRAMUSCULAR | Status: DC | PRN

## 2015-11-20 MED ORDER — FERROUS SULFATE 325 (65 FE) MG PO TABS: 325.0000 mg | ORAL_TABLET | Freq: Three times a day (TID) | ORAL | 3 refills | Status: DC

## 2015-11-20 MED ORDER — BISACODYL 10 MG RE SUPP: 10.0000 mg | Freq: Every day | RECTAL | Status: DC | PRN

## 2015-11-20 MED ORDER — DOCUSATE SODIUM 100 MG PO CAPS
100.0000 mg | ORAL_CAPSULE | Freq: Two times a day (BID) | ORAL | Status: DC
  Administered 2015-11-20: 100 mg via ORAL
  Filled 2015-11-20: qty 1

## 2015-11-20 MED ORDER — ONDANSETRON HCL 4 MG/2ML IJ SOLN
INTRAMUSCULAR | Status: DC | PRN
  Administered 2015-11-20: 4 mg via INTRAVENOUS

## 2015-11-20 MED ORDER — HYDROCODONE-ACETAMINOPHEN 7.5-325 MG PO TABS
1.0000 | ORAL_TABLET | ORAL | Status: DC
  Administered 2015-11-20: 1 via ORAL
  Administered 2015-11-20 – 2015-11-21 (×5): 2 via ORAL
  Filled 2015-11-20: qty 2
  Filled 2015-11-20: qty 1
  Filled 2015-11-20 (×4): qty 2

## 2015-11-20 MED ORDER — KETOROLAC TROMETHAMINE 30 MG/ML IJ SOLN
INTRAMUSCULAR | Status: AC
  Filled 2015-11-20: qty 1

## 2015-11-20 MED ORDER — METHOCARBAMOL 500 MG PO TABS
500.0000 mg | ORAL_TABLET | Freq: Four times a day (QID) | ORAL | Status: DC | PRN
  Administered 2015-11-20 – 2015-11-21 (×2): 500 mg via ORAL
  Filled 2015-11-20 (×2): qty 1

## 2015-11-20 MED ORDER — SODIUM CHLORIDE 0.9 % IJ SOLN
INTRAMUSCULAR | Status: AC
  Filled 2015-11-20: qty 50

## 2015-11-20 MED ORDER — ALUM & MAG HYDROXIDE-SIMETH 200-200-20 MG/5ML PO SUSP: 30.0000 mL | ORAL | Status: DC | PRN

## 2015-11-20 MED ORDER — ASPIRIN 81 MG PO CHEW
81.0000 mg | CHEWABLE_TABLET | Freq: Two times a day (BID) | ORAL | Status: DC
  Administered 2015-11-20 – 2015-11-21 (×2): 81 mg via ORAL
  Filled 2015-11-20 (×2): qty 1

## 2015-11-20 MED ORDER — MAGNESIUM CITRATE PO SOLN: 1.0000 | Freq: Once | ORAL | Status: DC | PRN

## 2015-11-20 MED ORDER — POLYETHYLENE GLYCOL 3350 17 G PO PACK: 17.0000 g | PACK | Freq: Two times a day (BID) | ORAL | 0 refills | Status: DC

## 2015-11-20 MED ORDER — DEXAMETHASONE SODIUM PHOSPHATE 10 MG/ML IJ SOLN
INTRAMUSCULAR | Status: AC
  Filled 2015-11-20: qty 1

## 2015-11-20 SURGICAL SUPPLY — 47 items
BAG DECANTER FOR FLEXI CONT (MISCELLANEOUS) IMPLANT
BAG SPEC THK2 15X12 ZIP CLS (MISCELLANEOUS)
BAG ZIPLOCK 12X15 (MISCELLANEOUS) IMPLANT
BANDAGE ACE 6X5 VEL STRL LF (GAUZE/BANDAGES/DRESSINGS) ×3 IMPLANT
BLADE SAW SGTL 13.0X1.19X90.0M (BLADE) ×3 IMPLANT
BONE CEMENT GENTAMICIN (Cement) ×6 IMPLANT
BOWL SMART MIX CTS (DISPOSABLE) ×3 IMPLANT
CAPT KNEE TOTAL 3 ATTUNE ×2 IMPLANT
CEMENT BONE GENTAMICIN 40 (Cement) IMPLANT
CLOTH BEACON ORANGE TIMEOUT ST (SAFETY) ×3 IMPLANT
CUFF TOURN SGL QUICK 34 (TOURNIQUET CUFF) ×3
CUFF TRNQT CYL 34X4X40X1 (TOURNIQUET CUFF) ×1 IMPLANT
DECANTER SPIKE VIAL GLASS SM (MISCELLANEOUS) ×3 IMPLANT
DRAPE U-SHAPE 47X51 STRL (DRAPES) ×3 IMPLANT
DRESSING AQUACEL AG SP 3.5X10 (GAUZE/BANDAGES/DRESSINGS) ×1 IMPLANT
DRSG AQUACEL AG ADV 3.5X10 (GAUZE/BANDAGES/DRESSINGS) ×2 IMPLANT
DRSG AQUACEL AG SP 3.5X10 (GAUZE/BANDAGES/DRESSINGS) ×3
DURAPREP 26ML APPLICATOR (WOUND CARE) ×6 IMPLANT
ELECT REM PT RETURN 9FT ADLT (ELECTROSURGICAL) ×3
ELECTRODE REM PT RTRN 9FT ADLT (ELECTROSURGICAL) ×1 IMPLANT
GLOVE BIOGEL M 7.0 STRL (GLOVE) IMPLANT
GLOVE BIOGEL PI IND STRL 7.5 (GLOVE) ×1 IMPLANT
GLOVE BIOGEL PI IND STRL 8.5 (GLOVE) ×1 IMPLANT
GLOVE BIOGEL PI INDICATOR 7.5 (GLOVE) ×14
GLOVE BIOGEL PI INDICATOR 8.5 (GLOVE)
GLOVE ECLIPSE 8.0 STRL XLNG CF (GLOVE) ×3 IMPLANT
GLOVE ORTHO TXT STRL SZ7.5 (GLOVE) ×6 IMPLANT
GOWN STRL REUS W/TWL LRG LVL3 (GOWN DISPOSABLE) ×5 IMPLANT
GOWN STRL REUS W/TWL XL LVL3 (GOWN DISPOSABLE) ×5 IMPLANT
HANDPIECE INTERPULSE COAX TIP (DISPOSABLE) ×3
LIQUID BAND (GAUZE/BANDAGES/DRESSINGS) ×3 IMPLANT
MANIFOLD NEPTUNE II (INSTRUMENTS) ×3 IMPLANT
PACK TOTAL KNEE CUSTOM (KITS) ×3 IMPLANT
POSITIONER SURGICAL ARM (MISCELLANEOUS) ×3 IMPLANT
SET HNDPC FAN SPRY TIP SCT (DISPOSABLE) ×1 IMPLANT
SET PAD KNEE POSITIONER (MISCELLANEOUS) ×3 IMPLANT
SUT MNCRL AB 4-0 PS2 18 (SUTURE) ×3 IMPLANT
SUT VIC AB 1 CT1 36 (SUTURE) ×3 IMPLANT
SUT VIC AB 2-0 CT1 27 (SUTURE) ×9
SUT VIC AB 2-0 CT1 TAPERPNT 27 (SUTURE) ×3 IMPLANT
SUT VLOC 180 0 24IN GS25 (SUTURE) ×3 IMPLANT
SYR 50ML LL SCALE MARK (SYRINGE) ×3 IMPLANT
TRAY FOLEY W/METER SILVER 14FR (SET/KITS/TRAYS/PACK) ×3 IMPLANT
TRAY FOLEY W/METER SILVER 16FR (SET/KITS/TRAYS/PACK) ×1 IMPLANT
WATER STERILE IRR 1500ML POUR (IV SOLUTION) ×5 IMPLANT
WRAP KNEE MAXI GEL POST OP (GAUZE/BANDAGES/DRESSINGS) ×3 IMPLANT
YANKAUER SUCT BULB TIP 10FT TU (MISCELLANEOUS) ×3 IMPLANT

## 2015-11-20 NOTE — Evaluation (Signed)
Physical Therapy Evaluation Patient Details Name: Maria Brennan MRN: [id number] DOB: Aug 02, 1945 Today's Date: 11/20/2015   History of Present Illness  RTKA  Clinical Impression  The patient tolerated ambulating 90' today. Plans home  With HHPT. Pt admitted with above diagnosis. Pt currently with functional limitations due to the deficits listed below (see PT Problem List). Pt will benefit from skilled PT to increase their independence and safety with mobility to allow discharge to the venue listed below.       Follow Up Recommendations  HHPT    Equipment Recommendations    RW,  crutches   Recommendations for Other Services       Precautions / Restrictions Precautions Precautions: Knee      Mobility  Bed Mobility Overal bed mobility: Needs Assistance Bed Mobility: Supine to Sit   Sidelying to sit: Min assist       General bed mobility comments: R leg support  Transfers Overall transfer level: Needs assistance Equipment used: Rolling walker (2 wheeled) Transfers: Sit to/from Stand Sit to Stand: Min assist         General transfer comment: cues for hand and R leg position  Ambulation/Gait Ambulation/Gait assistance: Min assist Ambulation Distance (Feet): 90 Feet Assistive device: Rolling walker (2 wheeled) Gait Pattern/deviations: Step-to pattern;Step-through pattern     General Gait Details: cues for sequence  Stairs            Wheelchair Mobility    Modified Rankin (Stroke Patients Only)       Balance                                             Pertinent Vitals/Pain Pain Assessment: 0-10 Pain Score: 3  Pain Location: R knee Pain Descriptors / Indicators: Discomfort;Tightness Pain Intervention(s): Monitored during session;Premedicated before session;Ice applied    Home Living Family/patient expects to be discharged to:: Private residence Living Arrangements: Spouse/significant other Available Help at Discharge:  Family Type of Home: House Home Access: Stairs to enter Entrance Stairs-Rails: Right Entrance Stairs-Number of Steps: [place]: One level Home Equipment: None      Prior Function Level of Independence: Independent               Hand Dominance        Extremity/Trunk Assessment   Upper Extremity Assessment: Defer to OT evaluation           Lower Extremity Assessment: RLE deficits/detail RLE Deficits / Details: SLR with a lag    Cervical / Trunk Assessment: Normal  Communication   Communication: No difficulties  Cognition Arousal/Alertness: Awake/alert Behavior During Therapy: WFL for tasks assessed/performed Overall Cognitive Status: Within Functional Limits for tasks assessed                      General Comments      Exercises        Assessment/Plan    PT Assessment Patient needs continued PT services  PT Diagnosis Difficulty walking;Acute pain   PT Problem List Decreased strength;Decreased range of motion;Decreased activity tolerance;Decreased mobility;Decreased safety awareness;Decreased knowledge of precautions;Decreased knowledge of use of DME;Pain  PT Treatment Interventions DME instruction;Gait training;Functional mobility training;Therapeutic activities;Therapeutic exercise;Patient/family education   PT Goals (Current goals can be found in the Care Plan section) Acute Rehab PT Goals Patient Stated Goal: to go home PT Goal Formulation: With patient/family Time For  Goal Achievement: 11/23/15 Potential to Achieve Goals: Good    Frequency 7X/week   Barriers to discharge        Co-evaluation               End of Session   Activity Tolerance: Patient tolerated treatment well Patient left: in chair;with call bell/phone within reach;with family/visitor present;with chair alarm set Nurse Communication: Mobility status         Time: [id number] PT Time Calculation (min) (ACUTE ONLY): 20 min   Charges:   PT  Evaluation $PT Eval Low Complexity: 1 Procedure     PT G CodesClaretha Cooper 11/20/2015, 7:19 PM

## 2015-11-20 NOTE — Interval H&P Note (Signed)
History and Physical Interval Note:  11/20/2015 8:54 AM  Maria Brennan  has presented today for surgery, with the diagnosis of OA right knee  The various methods of treatment have been discussed with the patient and family. After consideration of risks, benefits and other options for treatment, the patient has consented to  Procedure(s): RIGHT TOTAL KNEE ARTHROPLASTY (Right) as a surgical intervention .  The patient's history has been reviewed, patient examined, no change in status, stable for surgery.  I have reviewed the patient's chart and labs.  Questions were answered to the patient's satisfaction.     [name]

## 2015-11-20 NOTE — Transfer of Care (Signed)
Immediate Anesthesia Transfer of Care Note  Patient: Maria Brennan  Procedure(s) Performed: Procedure(s): RIGHT TOTAL KNEE ARTHROPLASTY (Right)  Patient Location: PACU  Anesthesia Type:Spinal  Level of Consciousness: awake, alert  and oriented  Airway & Oxygen Therapy: Patient Spontanous Breathing and Patient connected to face mask oxygen  Post-op Assessment: Report given to RN and Post -op Vital signs reviewed and stable  Post vital signs: Reviewed and stable  Last Vitals:  Vitals:   11/20/15 0712  BP: 119/65  Pulse: 88  Resp: 16  Temp: 36.7 C    Last Pain:  Vitals:   11/20/15 0730  TempSrc:   PainSc: 0-No pain      Patients Stated Pain Goal: 4 ([id number])  Complications: No apparent anesthesia complications

## 2015-11-20 NOTE — Op Note (Signed)
NAME:  Maria Brennan                      MEDICAL RECORD NO.:  [id number]                             FACILITY:  [facility]      PHYSICIAN:  Pietro Cassis. Alvan Dame, M.D.  DATE OF BIRTH:  12-12-1945      DATE OF PROCEDURE:  11/20/2015                                     OPERATIVE REPORT         PREOPERATIVE DIAGNOSIS:  Right knee osteoarthritis.      POSTOPERATIVE DIAGNOSIS:  Right knee osteoarthritis.      FINDINGS:  The patient was noted to have complete loss of cartilage and   bone-on-bone arthritis with associated osteophytes predominantly in the patellofemoral compartment of   the knee with a significant synovitis and associated effusion.      PROCEDURE:  Right total knee replacement.      COMPONENTS USED:  DePuy Attune rotating platform posterior stabilized knee   system, a size 3 femur, 3 tibia, size 6 PS AOX insert, and 32 anatomic patellar   button.      SURGEON:  Pietro Cassis. Alvan Dame, M.D.      ASSISTANT:  Nehemiah Massed, PA-C.      ANESTHESIA:  Spinal.      SPECIMENS:  None.      COMPLICATION:  None.      DRAINS:  None.  EBL: <100cc      TOURNIQUET TIME:   Total Tourniquet Time Documented: Thigh (Right) - 26 minutes Total: Thigh (Right) - 26 minutes  .      The patient was stable to the recovery room.      INDICATION FOR PROCEDURE:  Maria Brennan is a 70 y.o. female patient of   mine.  The patient had been seen, evaluated, and treated conservatively in the   office with medication, activity modification, and injections.  The patient had   radiographic changes of bone-on-bone arthritis with endplate sclerosis and osteophytes noted.      The patient failed conservative measures including medication, injections, and activity modification, and at this point was ready for more definitive measures.   Based on the radiographic changes and failed conservative measures, the patient   decided to proceed with total knee replacement.  Risks of infection,   DVT, component failure,  need for revision surgery, postop course, and   expectations were all   discussed and reviewed.  Consent was obtained for benefit of pain   relief.      PROCEDURE IN DETAIL:  The patient was brought to the operative theater.   Once adequate anesthesia, preoperative antibiotics, 2 gm of Ancef, 1 gm of Tranexamic Acid, and 10 mg of Decadron administered, the patient was positioned supine with the right thigh tourniquet placed.  The  right lower extremity was prepped and draped in sterile fashion.  A time-   out was performed identifying the patient, planned procedure, and   extremity.      The right lower extremity was placed in the [facility] leg holder.  The leg was   exsanguinated, tourniquet elevated to 250 mmHg.  A midline incision was   made followed by median parapatellar  arthrotomy.  Following initial   exposure, attention was first directed to the patella.  Precut   measurement was noted to be 20 mm.  I resected down to 14 mm and used a   32 patellar button to restore patellar height as well as cover the cut   surface.      The lug holes were drilled and a metal shim was placed to protect the   patella from retractors and saw blades.      At this point, attention was now directed to the femur.  The femoral   canal was opened with a drill, irrigated to try to prevent fat emboli.  An   intramedullary rod was passed at 3 degrees valgus, 9 mm of bone was   resected off the distal femur.  Following this resection, the tibia was   subluxated anteriorly.  Using the extramedullary guide, 2 mm of bone was resected off   the proximal medial tibia.  We confirmed the gap would be   stable medially and laterally with a size 5 spacer block insert as well as confirmed   the cut was perpendicular in the coronal plane, checking with an alignment rod.      Once this was done, I sized the femur to be a size 3 in the anterior-   posterior dimension, chose a standard component based on medial and    lateral dimension.  The size 3 rotation block was then pinned in   position anterior referenced using the C-clamp to set rotation.  The   anterior, posterior, and  chamfer cuts were made without difficulty nor   notching making certain that I was along the anterior cortex to help   with flexion gap stability.      The final box cut was made off the lateral aspect of distal femur.      At this point, the tibia was sized to be a size 3, the size 3 tray was   then pinned in position through the medial third of the tubercle,   drilled, and keel punched.  Trial reduction was now carried with a 3 femur,  3 tibia, a size 5 then 6 mm insert, and the 32 anatomic patella botton.  The knee was brought to   extension, full extension with good flexion stability with the patella   tracking through the trochlea without application of pressure.  Given   all these findings the femoral lug holes were drilled then the trial components removed.  Final components were   opened and cement was mixed.  The knee was irrigated with normal saline   solution and pulse lavage.  The synovial lining was   then injected with 30 cc of 0.25% Marcaine with epinephrine and 1 cc of Toradol plus 30cc of NS for a   total of 61 cc.      The knee was irrigated.  Final implants were then cemented onto clean and   dried cut surfaces of bone with the knee brought to extension with a size 6 mm trial insert.      Once the cement had fully cured, the excess cement was removed   throughout the knee.  I confirmed I was satisfied with the range of   motion and stability, and the final size PS AOX insert was chosen.  It was   placed into the knee.      The tourniquet had been let down at 26 minutes.  No significant  hemostasis required.  The   extensor mechanism was then reapproximated using #1 Vicryl and #0 V-lock sutures with the knee   in flexion.  The   remaining wound was closed with 2-0 Vicryl and running 4-0 Monocryl.   The  knee was cleaned, dried, dressed sterilely using Dermabond and   Aquacel dressing.  The patient was then   brought to recovery room in stable condition, tolerating the procedure   well.   Please note that Physician Assistant, Nehemiah Massed, PA-C, was present for the entirety of the case, and was utilized for pre-operative positioning, peri-operative retractor management, general facilitation of the procedure.  He was also utilized for primary wound closure at the end of the case.              Pietro Cassis Alvan Dame, M.D.    11/20/2015 11:08 AM

## 2015-11-20 NOTE — Anesthesia Procedure Notes (Signed)
Spinal  Patient location during procedure: OR Start time: 11/20/2015 9:56 AM End time: 11/20/2015 9:58 AM Staffing Anesthesiologist: [name] Performed: anesthesiologist  Preanesthetic Checklist Completed: patient identified, surgical consent, pre-op evaluation, timeout performed, IV checked, risks and benefits discussed and monitors and equipment checked Spinal Block Patient position: sitting Prep: Betadine Patient monitoring: heart rate, continuous pulse ox and blood pressure Approach: midline Location: L2-3 Injection technique: single-shot Needle Needle type: Pencan  Needle gauge: 24 G Needle length: 9 cm

## 2015-11-20 NOTE — Discharge Instructions (Signed)

## 2015-11-20 NOTE — Anesthesia Postprocedure Evaluation (Signed)
Anesthesia Post Note  Patient: Maria Brennan  Procedure(s) Performed: Procedure(s) (LRB): RIGHT TOTAL KNEE ARTHROPLASTY (Right)  Patient location during evaluation: PACU Anesthesia Type: Spinal Level of consciousness: oriented and awake and alert Pain management: pain level controlled Vital Signs Assessment: post-procedure vital signs reviewed and stable Respiratory status: spontaneous breathing, respiratory function stable and nonlabored ventilation Cardiovascular status: blood pressure returned to baseline and stable Postop Assessment: no headache and no backache Anesthetic complications: no    Last Vitals:  Vitals:   11/20/15 1405 11/20/15 1500  BP: 116/62 123/64  Pulse: 78 83  Resp: 16 16  Temp: 36.5 C 36.7 C    Last Pain:  Vitals:   11/20/15 1500  TempSrc: Oral  PainSc:                  [name]

## 2015-11-21 DIAGNOSIS — E663 Overweight: Secondary | ICD-10-CM | POA: Diagnosis present

## 2015-11-21 LAB — CBC
HCT: 28.9 % — ABNORMAL LOW (ref 36.0–46.0)
HEMOGLOBIN: 9.7 g/dL — AB (ref 12.0–15.0)
MCH: 31.2 pg (ref 26.0–34.0)
MCHC: 33.6 g/dL (ref 30.0–36.0)
MCV: 92.9 fL (ref 78.0–100.0)
PLATELETS: 275 10*3/uL (ref 150–400)
RBC: 3.11 MIL/uL — AB (ref 3.87–5.11)
RDW: 13.6 % (ref 11.5–15.5)
WBC: 11 10*3/uL — ABNORMAL HIGH (ref 4.0–10.5)

## 2015-11-21 LAB — BASIC METABOLIC PANEL
Anion gap: 5 (ref 5–15)
BUN: 18 mg/dL (ref 6–20)
CHLORIDE: 105 mmol/L (ref 101–111)
CO2: 26 mmol/L (ref 22–32)
CREATININE: 0.89 mg/dL (ref 0.44–1.00)
Calcium: 8.9 mg/dL (ref 8.9–10.3)
GFR calc Af Amer: >60 mL/min (ref >60)
GFR calc non Af Amer: >60 mL/min (ref >60)
GLUCOSE: 150 mg/dL — AB (ref 65–99)
POTASSIUM: 4.5 mmol/L (ref 3.5–5.1)
SODIUM: 136 mmol/L (ref 135–145)

## 2015-11-21 LAB — GLUCOSE, CAPILLARY
GLUCOSE-CAPILLARY: 127 mg/dL — AB (ref 65–99)
Glucose-Capillary: 168 mg/dL — ABNORMAL HIGH (ref 65–99)

## 2015-11-21 MED ORDER — HYDROCODONE-ACETAMINOPHEN 7.5-325 MG PO TABS: 1.0000 | ORAL_TABLET | ORAL | 0 refills | Status: DC | PRN

## 2015-11-21 MED ORDER — CELECOXIB 200 MG PO CAPS: 200.0000 mg | ORAL_CAPSULE | Freq: Two times a day (BID) | ORAL | 0 refills | Status: DC

## 2015-11-21 MED ORDER — ASPIRIN 81 MG PO CHEW: 81.0000 mg | CHEWABLE_TABLET | Freq: Two times a day (BID) | ORAL | 0 refills | Status: AC

## 2015-11-21 MED ORDER — TIZANIDINE HCL 4 MG PO TABS: 4.0000 mg | ORAL_TABLET | Freq: Four times a day (QID) | ORAL | 0 refills | Status: DC | PRN

## 2015-11-21 MED ORDER — FAMOTIDINE 20 MG PO TABS: 20.0000 mg | ORAL_TABLET | Freq: Two times a day (BID) | ORAL | Status: DC

## 2015-11-21 NOTE — Progress Notes (Signed)
Occupational Therapy Evaluation Patient Details Name: Maria Brennan MRN: [id number] DOB: Dec 14, 1945 Today's Date: 11/21/2015    History of Present Illness RTKA   Clinical Impression   All OT education completed and pt questions answered. No further OT needed at this time. Will sign off.    Follow Up Recommendations  No OT follow up;Supervision/Assistance - 24 hour    Equipment Recommendations  3 in 1 bedside comode    Recommendations for Other Services       Precautions / Restrictions Precautions Precautions: Knee;Fall Precaution Comments: fell at a cookout recently and bruised R leg Restrictions Weight Bearing Restrictions: No Other Position/Activity Restrictions: WBAT      Mobility Bed Mobility Overal bed mobility: Needs Assistance Bed Mobility: Sit to Supine       Sit to supine: Min guard   General bed mobility comments: pt crossed LLE under RLE  Transfers Overall transfer level: Needs assistance Equipment used: Rolling walker (2 wheeled) Transfers: Sit to/from Stand Sit to Stand: Supervision              Balance                                            ADL Overall ADL's : Needs assistance/impaired Eating/Feeding: Independent;Sitting   Grooming: Set up;Sitting   Upper Body Bathing: Set up;Sitting   Lower Body Bathing: Minimal assistance;Sit to/from stand   Upper Body Dressing : Set up;Sitting   Lower Body Dressing: Minimal assistance;Sit to/from stand   Toilet Transfer: Supervision/safety;Ambulation;BSC;RW   Toileting- Clothing Manipulation and Hygiene: Supervision/safety;Sitting/lateral lean   Tub/ Shower Transfer: Walk-in shower;Min guard;Ambulation;Rolling walker   Functional mobility during ADLs: Supervision/safety;Min guard;Rolling walker       Vision     Perception     Praxis      Pertinent Vitals/Pain Pain Assessment: 0-10 Pain Score: 5  Pain Location: R knee Pain Descriptors / Indicators:  Aching;Sore Pain Intervention(s): Monitored during session;Ice applied;Patient requesting pain meds-RN notified     Hand Dominance     Extremity/Trunk Assessment Upper Extremity Assessment Upper Extremity Assessment: Overall WFL for tasks assessed   Lower Extremity Assessment Lower Extremity Assessment: Defer to PT evaluation   Cervical / Trunk Assessment Cervical / Trunk Assessment: Normal   Communication Communication Communication: No difficulties   Cognition Arousal/Alertness: Awake/alert Behavior During Therapy: WFL for tasks assessed/performed Overall Cognitive Status: Within Functional Limits for tasks assessed                     General Comments       Exercises       Shoulder Instructions      Home Living Family/patient expects to be discharged to:: Private residence Living Arrangements: Spouse/significant other Available Help at Discharge: Family Type of Home: House Home Access: Stairs to enter Technical brewer of Steps: 13 Entrance Stairs-Rails: Right Home Layout: One level     Bathroom Shower/Tub: Occupational psychologist: Standard Bathroom Accessibility: Yes How Accessible: Accessible via walker Home Equipment: None          Prior Functioning/Environment Level of Independence: Independent             OT Diagnosis: Acute pain   OT Problem List: Decreased strength;Decreased range of motion;Decreased knowledge of use of DME or AE;Pain   OT Treatment/Interventions:      OT Goals(Current goals can be  found in the care plan section) Acute Rehab OT Goals Patient Stated Goal: home today OT Goal Formulation: All assessment and education complete, DC therapy  OT Frequency:     Barriers to D/C:            Co-evaluation              End of Session Equipment Utilized During Treatment: Rolling walker Nurse Communication: Patient requests pain meds  Activity Tolerance: Patient tolerated treatment well Patient  left: in bed;with call bell/phone within reach   Time: 1005-1034 OT Time Calculation (min): 29 min Charges:  OT General Charges $OT Visit: 1 Procedure OT Evaluation $OT Eval Low Complexity: 1 Procedure OT Treatments $Self Care/Home Management : 8-22 mins G-Codes:    Bryony Kaman A 11-28-15, 10:46 AM

## 2015-11-21 NOTE — Progress Notes (Signed)
     Subjective: 1 Day Post-Op Procedure(s) (LRB): RIGHT TOTAL KNEE ARTHROPLASTY (Right)   Patient reports pain as mild, pain controlled. No events throughout the night.  Ready to be discharged home if she does well with PT and pain stays controlled.   Objective:   VITALS:   Vitals:   11/21/15 0117 11/21/15 0600  BP: (!) 119/55 (!) 118/55  Pulse: 92 99  Resp: 16 16  Temp: 97.3 F (36.3 C) 97.9 F (36.6 C)    Dorsiflexion/Plantar flexion intact Incision: dressing C/D/I No cellulitis present Compartment soft  LABS  Recent Labs  11/21/15 0429  HGB 9.7*  HCT 28.9*  WBC 11.0*  PLT 275     Recent Labs  11/21/15 0429  NA 136  K 4.5  BUN 18  CREATININE 0.89  GLUCOSE 150*     Assessment/Plan: 1 Day Post-Op Procedure(s) (LRB): RIGHT TOTAL KNEE ARTHROPLASTY (Right) Foley cath d/c'ed Advance diet Up with therapy D/C IV fluids Discharge home with home health  Follow up in 2 weeks at [facility]. Follow up with OLIN,Abbye Lao D in 2 weeks.  Contact information:  [facility] [address], [place] [id number]    Overweight (BMI 25-29.9)  Estimated body mass index is 26.35 kg/m as calculated from the following:   Height as of this encounter: 5' 7.75" (1.721 m).   Weight as of this encounter: 78 kg (172 lb). Patient also counseled that weight may inhibit the healing process Patient counseled that losing weight will help with future health issues          [place] Pugh. Gaby Harney   PAC  11/21/2015, 9:05 AM

## 2015-11-21 NOTE — Progress Notes (Signed)
Physical Therapy Treatment Patient Details Name: Maria Brennan MRN: [id number] DOB: 09/10/1945 Today's Date: 11/21/2015    History of Present Illness RTKA    PT Comments    POD # 1 am session Assisted OOB to amb in hallway and practice stairs with spouse.  Pt instructed on activity level and safe use of walker all times.  Instructed on proper positioning R LE and use of ICE. Pt ready for D/C to home.   Follow Up Recommendations        Equipment Recommendations  Rolling walker with 5" wheels;3in1 (PT)    Recommendations for Other Services       Precautions / Restrictions Precautions Precautions: Fall Precaution Comments: fell at a cookout recently and bruised R leg wearing flip flops Restrictions Weight Bearing Restrictions: No Other Position/Activity Restrictions: WBAT    Mobility  Bed Mobility Overal bed mobility: Needs Assistance Bed Mobility: Supine to Sit;Sit to Supine     Supine to sit: Min guard Sit to supine: Min guard   General bed mobility comments: pt crossed LLE under RLE increased time  Transfers Overall transfer level: Needs assistance Equipment used: Rolling walker (2 wheeled) Transfers: Sit to/from Stand Sit to Stand: Supervision            Ambulation/Gait Ambulation/Gait assistance: Min guard Ambulation Distance (Feet): 45 Feet Assistive device: Rolling walker (2 wheeled) Gait Pattern/deviations: Step-to pattern;Step-through pattern     General Gait Details: VC's to decrease gait speed and equal WBing    Stairs Stairs: Yes Stairs assistance: Min assist Stair Management: One rail Right;Step to pattern;Forwards Number of Stairs: 4 General stair comments: with spouse present for "hands on" instruction  Wheelchair Mobility    Modified Rankin (Stroke Patients Only)       Balance                                    Cognition Arousal/Alertness: Awake/alert Behavior During Therapy: WFL for tasks  assessed/performed Overall Cognitive Status: Within Functional Limits for tasks assessed                      Exercises      General Comments        Pertinent Vitals/Pain Pain Assessment: 0-10 Pain Score: 2  Pain Location: R knee Pain Descriptors / Indicators: Aching;Sore Pain Intervention(s): Monitored during session;Repositioned;Ice applied    Home Living Family/patient expects to be discharged to:: Private residence Living Arrangements: Spouse/significant other Available Help at Discharge: Family Type of Home: House Home Access: Stairs to enter Entrance Stairs-Rails: Right Home Layout: One level Home Equipment: None      Prior Function Level of Independence: Independent          PT Goals (current goals can now be found in the care plan section) Acute Rehab PT Goals Patient Stated Goal: home today Progress towards PT goals: Progressing toward goals    Frequency  7X/week    PT Plan Current plan remains appropriate    Co-evaluation             End of Session Equipment Utilized During Treatment: Gait belt Activity Tolerance: Patient tolerated treatment well Patient left: in bed;with call bell/phone within reach;with family/visitor present;with nursing/sitter in room     Time: 1202-1228 PT Time Calculation (min) (ACUTE ONLY): 26 min  Charges:  $Gait Training: 8-22 mins $Therapeutic Activity: 8-22 mins  G Codes:      Rica Koyanagi  PTA [facility]  Acute  Rehab Pager      [phone]

## 2015-11-21 NOTE — Care Management Note (Signed)
Case Management Note  Patient Details  Name: Maria Brennan MRN: [id number] Date of Birth: [birth year]  Subjective/Objective:                  RIGHT TOTAL KNEE ARTHROPLASTY (Right)  Action/Plan: Discharge planning Expected Discharge Date:  11/21/15               Expected Discharge Plan:  [place]  In-House Referral:     Discharge planning Services  CM Consult  Post Acute Care Choice:  Home Health Choice offered to:  Patient  DME Arranged:  3-N-1, Walker rolling DME Agency:  [place]:  PT [place] Agency:  [facility]  Status of Service:  Completed, signed off  If discussed at [employer], dates discussed:    Additional Comments: CM met with pt in room to offer choice of home health agency.  Pt chooses [facility] to render HHPT.  Referral given to [facility] rep, Tim. CM notified [facility] DME rep, Jermaine to please to deliver the 3n1 and rolling walker to room so pt can discharge.  No other CM needs were communicated. Dellie Catholic, RN 11/21/2015, 9:22 AM

## 2015-11-24 NOTE — Discharge Summary (Signed)
Physician Discharge Summary  Patient ID: Kassie Roehr MRN: [id number] DOB/AGE: 69-11-1945 69 y.o.  Admit date: 11/20/2015 Discharge date: 11/21/2015   Procedures:  Procedure(s) (LRB): RIGHT TOTAL KNEE ARTHROPLASTY (Right)  Attending Physician:  Dr. Paralee Cancel   Admission Diagnoses:   Right knee primary OA / pain  Discharge Diagnoses:  Principal Problem:   S/P right TKA Active Problems:   Overweight (BMI 25.0-29.9)  Past Medical History:  Diagnosis Date  . Arthritis    oa  . Diabetes mellitus without complication ([place])   . Family history of adverse reaction to anesthesia    mother and sister slow to awaken   . GERD (gastroesophageal reflux disease)   . History of hiatal hernia   . Hypertension   . MVP (mitral valve prolapse)   . PONV (postoperative nausea and vomiting) yrs ago    HPI:    Renatha Mushinski, 70 y.o. female, has a history of pain and functional disability in the right knee due to arthritis and has failed non-surgical conservative treatments for greater than 12 weeks to includeNSAID's and/or analgesics, corticosteriod injections and activity modification.  Onset of symptoms was gradual, starting 2-3 years ago with gradually worsening course since that time. The patient noted no past surgery on the right knee(s).  Patient currently rates pain in the right knee(s) at 8 out of 10 with activity. Patient has worsening of pain with activity and weight bearing, pain that interferes with activities of daily living, pain with passive range of motion, crepitus and joint swelling.  Patient has evidence of periarticular osteophytes and joint space narrowing by imaging studies.  There is no active infection.   Risks, benefits and expectations were discussed with the patient.  Risks including but not limited to the risk of anesthesia, blood clots, nerve damage, blood vessel damage, failure of the prosthesis, infection and up to and including death.  Patient understand the risks,  benefits and expectations and wishes to proceed with surgery.   PCP: [name], MD   Discharged Condition: good  Hospital Course:  Patient underwent the above stated procedure on 11/20/2015. Patient tolerated the procedure well and brought to the recovery room in good condition and subsequently to the floor.  POD #1 BP: 118/55 ; Pulse: 99 ; Temp: 97.9 F (36.6 C) ; Resp: 16 Patient reports pain as mild, pain controlled. No events throughout the night.  Ready to be discharged home. Dorsiflexion/plantar flexion intact, incision: dressing C/D/I, no cellulitis present and compartment soft.   LABS  Basename    HGB     9.7  HCT     28.9    Discharge Exam: General appearance: alert, cooperative and no distress Extremities: Homans sign is negative, no sign of DVT, no edema, redness or tenderness in the calves or thighs and no ulcers, gangrene or trophic changes  Disposition: Home with follow up in 2 weeks   Follow-up Information    [name], MD. Schedule an appointment as soon as possible for a visit in 2 week(s).   Specialty:  Orthopedic Surgery Contact information: [address] [place] [address] [phone]        [facility] .   Why:  3n1 (bedside commode) and rolling walker Contact information: [address] [place] [address] [phone]        [facility] .   Why:  now known as [facility]; this is your home health agency who will call you to schedule  your home physical therapy. Contact information: [place] [address] [phone]           Discharge Instructions    Call MD / Call 911    Complete by:  As directed    If you experience chest pain or shortness of breath, CALL 911 and be transported to the hospital emergency room.  If you develope a fever above 101 F, pus (white drainage) or increased drainage or redness at the wound, or calf pain, call your  surgeon's office.   Change dressing    Complete by:  As directed    Maintain surgical dressing until follow up in the clinic. If the edges start to pull up, may reinforce with tape. If the dressing is no longer working, may remove and cover with gauze and tape, but must keep the area dry and clean.  Call with any questions or concerns.   Constipation Prevention    Complete by:  As directed    Drink plenty of fluids.  Prune juice may be helpful.  You may use a stool softener, such as Colace (over the counter) 100 mg twice a day.  Use MiraLax (over the counter) for constipation as needed.   Diet - low sodium heart healthy    Complete by:  As directed    Discharge instructions    Complete by:  As directed    Maintain surgical dressing until follow up in the clinic. If the edges start to pull up, may reinforce with tape. If the dressing is no longer working, may remove and cover with gauze and tape, but must keep the area dry and clean.  Follow up in 2 weeks at [facility]. Call with any questions or concerns.   Increase activity slowly as tolerated    Complete by:  As directed    Weight bearing as tolerated with assist device (walker, cane, etc) as directed, use it as long as suggested by your surgeon or therapist, typically at least 4-6 weeks.   TED hose    Complete by:  As directed    Use stockings (TED hose) for 2 weeks on both leg(s).  You may remove them at night for sleeping.        Medication List    STOP taking these medications   aspirin EC 81 MG tablet Replaced by:  aspirin 81 MG chewable tablet     TAKE these medications   aspirin 81 MG chewable tablet Chew 1 tablet (81 mg total) by mouth 2 (two) times daily. Take for 4 weeks, then resume regular dose. Replaces:  aspirin EC 81 MG tablet   atorvastatin 40 MG tablet Commonly known as:  LIPITOR Take 40 mg by mouth every evening.   BIOTIN PO Take 1 tablet by mouth daily.   celecoxib 200 MG capsule Commonly  known as:  CELEBREX Take 1 capsule (200 mg total) by mouth every 12 (twelve) hours. Take with Pepcid to help prevent GI upset / ulcers.   docusate sodium 100 MG capsule Commonly known as:  COLACE Take 1 capsule (100 mg total) by mouth 2 (two) times daily. What changed:  medication strength  how much to take  when to take this   DULoxetine 30 MG capsule Commonly known as:  CYMBALTA Take 30 mg by mouth 2 (two) times daily.   famotidine 20 MG tablet Commonly known as:  PEPCID Take 1 tablet (20 mg total) by mouth 2 (two) times daily.  ferrous sulfate 325 (65 FE) MG tablet Take 1 tablet (325 mg total) by mouth 3 (three) times daily after meals.   GARLIC PO Take 1 capsule by mouth daily.   GLUCOSAMINE-CHONDROITIN PO Take 1 tablet by mouth 2 (two) times daily.   HYDROcodone-acetaminophen 7.5-325 MG tablet Commonly known as:  NORCO Take 1-2 tablets by mouth every 4 (four) hours as needed for moderate pain.   hydrOXYzine 25 MG tablet Commonly known as:  ATARAX/VISTARIL Take 25 mg by mouth at bedtime.   metFORMIN 500 MG tablet Commonly known as:  GLUCOPHAGE Take 500 mg by mouth daily.   omeprazole 40 MG capsule Commonly known as:  PRILOSEC Take 40 mg by mouth every evening.   polyethylene glycol packet Commonly known as:  MIRALAX / GLYCOLAX Take 17 g by mouth 2 (two) times daily.   Potassium 99 MG Tabs Take 99 mg by mouth daily.   telmisartan 40 MG tablet Commonly known as:  MICARDIS Take 40 mg by mouth daily.   tiZANidine 4 MG tablet Commonly known as:  ZANAFLEX Take 1 tablet (4 mg total) by mouth every 6 (six) hours as needed for muscle spasms.   triamterene-hydrochlorothiazide 37.5-25 MG tablet Commonly known as:  MAXZIDE-25 Take 0.5 tablets by mouth daily.   VICTOZA 18 MG/3ML Sopn Generic drug:  Liraglutide Inject 1.8 mg into the skin daily.   VITAMIN B-12 PO Take 1 tablet by mouth daily.   Vitamin D3 2000 units Tabs Take 2,000 Units by mouth  daily.   zolpidem 5 MG tablet Commonly known as:  AMBIEN Take 5 mg by mouth at bedtime.        Signed: [place] P[place]. Leigha Olberding   PA-C  11/24/2015, 3:08 PM

## 2016-05-12 ENCOUNTER — Encounter: Payer: Self-pay | Admitting: Neurology

## 2016-05-12 ENCOUNTER — Ambulatory Visit (INDEPENDENT_AMBULATORY_CARE_PROVIDER_SITE_OTHER): Payer: Medicare Other | Admitting: Neurology

## 2016-05-12 VITALS — BP 114/70 | HR 84 | Ht 67.75 in | Wt 174.5 lb

## 2016-05-12 DIAGNOSIS — R0683 Snoring: Secondary | ICD-10-CM | POA: Insufficient documentation

## 2016-05-12 DIAGNOSIS — G4700 Insomnia, unspecified: Secondary | ICD-10-CM

## 2016-05-12 DIAGNOSIS — G4719 Other hypersomnia: Secondary | ICD-10-CM | POA: Insufficient documentation

## 2016-05-12 DIAGNOSIS — F3341 Major depressive disorder, recurrent, in partial remission: Secondary | ICD-10-CM | POA: Diagnosis not present

## 2016-05-12 DIAGNOSIS — G2581 Restless legs syndrome: Secondary | ICD-10-CM | POA: Insufficient documentation

## 2016-05-12 MED ORDER — GABAPENTIN 300 MG PO CAPS: ORAL_CAPSULE | ORAL | 11 refills | Status: DC

## 2016-05-12 MED ORDER — ESZOPICLONE 2 MG PO TABS: 2.0000 mg | ORAL_TABLET | Freq: Every evening | ORAL | 5 refills | Status: DC | PRN

## 2016-05-12 NOTE — Progress Notes (Signed)
[facility]  PATIENT: Maria Brennan DOB: Apr 29, 1945  REFERRING DOCTOR OR PCP:  [name] SOURCE: patient, notes form [name], labwork from [facility]  _________________________________   HISTORICAL  CHIEF COMPLAINT:  Chief Complaint  Patient presents with  . Sleep Disturbance    Maria Brennan is here with her husband, Liliane Channel, for eval of difficulty going to and staying asleep, and restless legs, most noticeable at night.  Sts. Ambien 10mg  sometimes helps.   Also occasionally uses otc sleep meds./fim    HISTORY OF PRESENT ILLNESS:  I had the pleasure seeing you patient, Maria Brennan, at [facility] for neurologic consultation regarding her insomnia and restless leg syndrome.   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  On a typical night, she will lay down to fall asleep around midnight but oftn can't fall asleep and then is up until 2 am or later.  Once asleep, she may wake up once or twice later in the night but usually can fall back asleep after she goes to the bathroom to urinate.    She tries zolpidem 5 mg without much benefit but zolpidem 10 mg helps most of the time.     She takes Advil PM some nights but it only helps a little bit and makes her mouth dry.   Hydroxyzine sometimes helps but she can have a hangover if she takes  She also has RLS when she lays down at night.   She can't get comfortable and has to move after a couple minutes.  Sometimes she gets out of bed and walks around and then goes back to bed.    Zolpidem will help the RLS.    Advil PM sometimes will worsen the RLS though it helps her fall asleep.    She denies anemia.  She has NIDDM but has no foot numbness or pain.     She has never had a PSG.   Her husband notes she snores at night and will sometimes snort or gasp.   She talks in her sleep, often after a snort/sleep arousal.     She is sleepy during the day.    She has lost  some weight on Victoza.   She smokes a little (most nights).    She has depression and OCD and is on Cymbalta with benefit.    EPWORTH SLEEPINESS SCALE  On a scale of 0 - 3 what is the chance of dozing:  Sitting and Reading:   3 Watching TV:    3 Sitting inactive in a public place: 1 Passenger in car for one hour: 3 Lying down to rest in the afternoon: 3 Sitting and talking to someone: 0 Sitting quietly after lunch:  3 In a car, stopped in traffic:  0  Total (out of 24):   16/24 (moderate sleepiness)     REVIEW OF SYSTEMS: Constitutional: No fevers, chills, sweats, or change in appetite Eyes: No visual changes, double vision, eye pain Ear, nose and throat: No hearing loss, ear pain, nasal congestion, sore throat Cardiovascular: No chest pain, palpitations Respiratory: No shortness of breath at rest or with exertion.   No wheezes GastrointestinaI: No nausea, vomiting, diarrhea, abdominal pain, fecal incontinence Genitourinary: No dysuria, urinary retention or frequency.  No nocturia. Musculoskeletal: No neck pain, back pain Integumentary: No rash, pruritus, skin lesions Neurological: as above Psychiatric: No depression at this time.  No anxiety Endocrine: No palpitations, diaphoresis, change in appetite, change in  weigh or increased thirst Hematologic/Lymphatic: No anemia, purpura, petechiae. Allergic/Immunologic: No itchy/runny eyes, nasal congestion, recent allergic reactions, rashes  ALLERGIES: Allergies  Allergen Reactions  . Rosuvastatin Other (See Comments)  . Codeine Itching and Nausea Only    HOME MEDICATIONS:  Current Outpatient Prescriptions:  .  atorvastatin (LIPITOR) 40 MG tablet, Take 40 mg by mouth every evening. , Disp: , Rfl: 0 .  Biotin 10 MG TABS, Take by mouth., Disp: , Rfl:  .  Cholecalciferol (VITAMIN D3) 2000 units TABS, Take 2,000 Units by mouth daily., Disp: , Rfl:  .  docusate sodium (COLACE) 100 MG capsule, Take 1 capsule (100 mg total)  by mouth 2 (two) times daily., Disp: 10 capsule, Rfl: 0 .  DULoxetine (CYMBALTA) 30 MG capsule, Take 30 mg by mouth 2 (two) times daily., Disp: , Rfl: 0 .  GLUCOSAMINE-CHONDROITIN PO, Take 1 tablet by mouth 2 (two) times daily., Disp: , Rfl:  .  Liraglutide (VICTOZA) 18 MG/3ML SOPN, Inject 1.8 mg into the skin daily., Disp: , Rfl:  .  magnesium oxide (MAG-OX) 400 MG tablet, Take 400 mg by mouth daily., Disp: , Rfl:  .  metFORMIN (GLUCOPHAGE) 500 MG tablet, Take 500 mg by mouth daily., Disp: , Rfl: 0 .  omeprazole (PRILOSEC) 40 MG capsule, Take 40 mg by mouth every evening. , Disp: , Rfl: 0 .  polyethylene glycol (MIRALAX / GLYCOLAX) packet, Take 17 g by mouth 2 (two) times daily., Disp: 14 each, Rfl: 0 .  triamterene-hydrochlorothiazide (MAXZIDE-25) 37.5-25 MG tablet, Take 0.5 tablets by mouth daily. , Disp: , Rfl:  .  Turmeric 500 MG TABS, Take by mouth., Disp: , Rfl:  .  zolpidem (AMBIEN) 5 MG tablet, Take 5 mg by mouth at bedtime., Disp: , Rfl: 1 .  Cyanocobalamin (VITAMIN B-12 PO), Take 1 tablet by mouth daily., Disp: , Rfl:  .  Potassium 99 MG TABS, Take 99 mg by mouth daily., Disp: , Rfl:   PAST MEDICAL HISTORY: Past Medical History:  Diagnosis Date  . Arthritis    oa  . Diabetes mellitus without complication ([place])   . Family history of adverse reaction to anesthesia    mother and sister slow to awaken   . GERD (gastroesophageal reflux disease)   . History of hiatal hernia   . Hypertension   . MVP (mitral valve prolapse)   . PONV (postoperative nausea and vomiting) yrs ago    PAST SURGICAL HISTORY: Past Surgical History:  Procedure Laterality Date  . ABDOMINAL HYSTERECTOMY     partial  . BREAST SURGERY Bilateral    implants  . rotator  cuff Right   . 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   Procedure: RIGHT TOTAL KNEE ARTHROPLASTY;  Surgeon: Paralee Cancel, MD;  Location: [facility];  Service: Orthopedics;  Laterality: Right;    FAMILY HISTORY: No family history on  file.  SOCIAL HISTORY:  Social History   Social History  . Marital status: Married    Spouse name: N/A  . Number of children: N/A  . Years of education: N/A   Occupational History  . Not on file.   Social History Main Topics  . Smoking status: Current Every Day Smoker    Packs/day: 0.50    Years: 53.00    Types: Cigarettes  . Smokeless tobacco: Never Used  . Alcohol use No  . Drug use: No  . Sexual activity: Not on file   Other Topics Concern  . Not on file   Social History  Narrative  . No narrative on file     PHYSICAL EXAM  Vitals:   05/12/16 1053  BP: 114/70  Pulse: 84  Weight: 174 lb 8 oz (79.2 kg)  Height: 5' 7.75" (1.721 m)    Body mass index is 26.73 kg/m.   General: The patient is well-developed and well-nourished and in no acute distress  HEENT:  [place]/AT, pharynx is Mallampati 2.    Neck: The neck is supple, no carotid bruits are noted.  The neck is nontender.  Cardiovascular: The heart has a regular rate and rhythm with a normal S1 and S2. There were no murmurs, gallops or rubs. Lungs are clear to auscultation.  Skin: Extremities are without significant edema.  Musculoskeletal:  Back is nontender  Neurologic Exam  Mental status: The patient is alert and oriented x 3 at the time of the examination. The patient has apparent normal recent and remote memory, with an apparently normal attention span and concentration ability.   Speech is normal.  Cranial nerves: Extraocular movements are full. Pupils are equal, round, and reactive to light and accomodation.  Visual fields are full.  Facial symmetry is present. There is good facial sensation to soft touch bilaterally.Facial strength is normal.  Trapezius and sternocleidomastoid strength is normal. No dysarthria is noted.  The tongue is midline, and the patient has symmetric elevation of the soft palate. Mild reduced left hearing.  Motor:  Muscle bulk is normal.   Tone is normal. Strength is  5 / 5 in  all 4 extremities.   Sensory: Sensory testing is intact to soft touch and vibration sensation in all 4 extremities.  Coordination: Cerebellar testing reveals good finger-nose-finger and heel-to-shin bilaterally.  Gait and station: Station is normal.   Gait is normal. Tandem gait is normal. Romberg is negative.   Reflexes: Deep tendon reflexes are symmetric and normal bilaterally.   Plantar responses are flexor.    DIAGNOSTIC DATA (LABS, IMAGING, TESTING) - I reviewed patient records, labs, notes, testing and imaging myself where available.  Lab Results  Component Value Date   WBC 11.0 (H) 11/21/2015   HGB 9.7 (L) 11/21/2015   HCT 28.9 (L) 11/21/2015   MCV 92.9 11/21/2015   PLT 275 11/21/2015      Component Value Date/Time   NA 136 11/21/2015 0429   K 4.5 11/21/2015 0429   CL 105 11/21/2015 0429   CO2 26 11/21/2015 0429   GLUCOSE 150 (H) 11/21/2015 0429   BUN 18 11/21/2015 0429   CREATININE 0.89 11/21/2015 0429   CALCIUM 8.9 11/21/2015 0429   GFRNONAA >60 11/21/2015 0429   GFRAA >60 11/21/2015 0429   No results found for: CHOL, HDL, LDLCALC, LDLDIRECT, TRIG, CHOLHDL Lab Results  Component Value Date   HGBA1C 6.0 (H) 11/09/2015       ASSESSMENT AND PLAN  Insomnia, unspecified type - Plan: Split night study, CBC with Differential/Platelet  Restless leg syndrome - Plan: CBC with Differential/Platelet, Ferritin  Recurrent major depressive disorder, in partial remission ([place]) - Plan: CBC with Differential/Platelet, Ferritin  Snoring - Plan: Split night study  Excessive daytime sleepiness - Plan: Split night study   In summary, Maria Brennan is a 71 year old woman with insomnia and restless leg syndrome who also has snoring and excessive daytime sleepiness.   To help with her insomnia and restless leg syndrome, I prescribed 2 mg Lunesta and will also have her take 300-600 mg gabapentin at night. We will also check a ferritin and blood count as low  iron may also be  associated with worsening RLS.  We discussed that Cymbalta could worsen restless leg syndrome and if the gabapentin does not improve her symptoms we should consider stopping Cymbalta or switching to a different medication. We also discussed her snoring and excessive daytime sleepiness. Additionally the husband has witnessed gasping at night. She appears to have some obstructive sleep apnea we will check a split-night study to better characterize. She notes that she is claustrophobic and if she is unable to tolerate a mask she believes she would be able to do an oral appliance.  She will return to see me in a couple months or sooner if she has new or worsening neurologic symptoms.  Thank you for asking me to see Maria Brennan for a neurologic consultation. Please let me know if I can be of further assistance with her or other patients in the future.   Maria Revelle A. Felecia Shelling, MD, PhD [id number], [id number] AM Certified in Neurology, Clinical Neurophysiology, Sleep Medicine, Pain Medicine and Neuroimaging  [facility] [address] [place], [place] [address] [phone]

## 2016-05-13 LAB — CBC WITH DIFFERENTIAL/PLATELET
BASOS ABS: 0 10*3/uL (ref 0.0–0.2)
BASOS: 1 %
EOS (ABSOLUTE): 0.7 10*3/uL — AB (ref 0.0–0.4)
Eos: 9 %
Hematocrit: 35.9 % (ref 34.0–46.6)
Hemoglobin: 12.1 g/dL (ref 11.1–15.9)
IMMATURE GRANS (ABS): 0 10*3/uL (ref 0.0–0.1)
IMMATURE GRANULOCYTES: 0 %
Lymphocytes Absolute: 1.9 10*3/uL (ref 0.7–3.1)
Lymphs: 25 %
MCH: 30.5 pg (ref 26.6–33.0)
MCHC: 33.7 g/dL (ref 31.5–35.7)
MCV: 90 fL (ref 79–97)
Monocytes Absolute: 0.5 10*3/uL (ref 0.1–0.9)
Monocytes: 7 %
NEUTROS PCT: 58 %
Neutrophils Absolute: 4.5 10*3/uL (ref 1.4–7.0)
PLATELETS: 394 10*3/uL — AB (ref 150–379)
RBC: 3.97 x10E6/uL (ref 3.77–5.28)
RDW: 13 % (ref 12.3–15.4)
WBC: 7.6 10*3/uL (ref 3.4–10.8)

## 2016-05-13 LAB — FERRITIN: FERRITIN: 40 ng/mL (ref 15–150)

## 2016-05-14 ENCOUNTER — Telehealth: Payer: Self-pay | Admitting: *Deleted

## 2016-05-14 NOTE — Telephone Encounter (Signed)
LMOM (identified vm) that per RAS, labs done in our office are normal.  She does not need to return this call unless she has questions/fim

## 2016-05-14 NOTE — Telephone Encounter (Signed)
-----   Message from Britt Bottom, MD sent at 05/13/2016  5:39 PM EST ----- Please let her know the bloodwork was normal

## 2016-05-26 ENCOUNTER — Ambulatory Visit (INDEPENDENT_AMBULATORY_CARE_PROVIDER_SITE_OTHER): Payer: Medicare Other | Admitting: Neurology

## 2016-05-26 DIAGNOSIS — R0683 Snoring: Secondary | ICD-10-CM

## 2016-05-26 DIAGNOSIS — G4700 Insomnia, unspecified: Secondary | ICD-10-CM

## 2016-05-26 DIAGNOSIS — G4733 Obstructive sleep apnea (adult) (pediatric): Secondary | ICD-10-CM | POA: Diagnosis not present

## 2016-05-26 DIAGNOSIS — G4719 Other hypersomnia: Secondary | ICD-10-CM

## 2016-05-27 DIAGNOSIS — Z9989 Dependence on other enabling machines and devices: Secondary | ICD-10-CM

## 2016-05-27 DIAGNOSIS — G4733 Obstructive sleep apnea (adult) (pediatric): Secondary | ICD-10-CM | POA: Insufficient documentation

## 2016-05-27 NOTE — Progress Notes (Signed)
PATIENT'S NAME:  Lenzi, Marmo DOB:      1945/10/30      MR#:    [id number]     DATE OF RECORDING: 05/26/2016 REFERRING M.D.:  Valaria Good. Karle Starch, MD Study Performed:   Baseline Polysomnogram  HISTORY:  She is a 71 year old woman with snoring, daytime sleepiness and restless legs   The patient's weight 174 pounds with a height of 68 (inches), resulting in a BMI of 27. kg/m2.  The patient's neck circumference measured 16 inches.  CURRENT MEDICATIONS: Atorvastatin, Biotin, Cholecalciferol, Cyanocobalamin, Docusate sodium, Duloxetine, Eszopiclone, Gabapentin, Glucosamine, Liraglutide, Magnesium oxide, Metformin, Omeprazole, Polyethylene, Potassium, Triamterene-Hydrochlorothiazide, Tumeric   PROCEDURE:  This is a multichannel digital polysomnogram utilizing the Somnostar 11.2 system.  Electrodes and sensors were applied and monitored per AASM Specifications.   EEG, EOG, Chin and Limb EMG, were sampled at 200 Hz.  ECG, Snore and Nasal Pressure, Thermal Airflow, Respiratory Effort, CPAP Flow and Pressure, Oximetry was sampled at 50 Hz. Digital video and audio were recorded.      BASELINE STUDY  Lights Out was at 22:26 and Lights On at 05:52.  Total recording time (TRT) was 446 minutes, with a total sleep time (TST) of  297 minutes.   The patient's sleep latency was 126 minutes.  REM latency was 259 minutes.  The sleep efficiency was 66.6 %.     SLEEP ARCHITECTURE: WASO (Wake after sleep onset) was 44 minutes.  There were 20 minutes in Stage N1, 207 minutes Stage N2, 0 minutes Stage N3 and 70 minutes in Stage REM.  The percentage of Stage N1 was 6.7%, Stage N2 was 69.7%, Stage N3 was 0% and Stage R (REM sleep) was 23.6%.   The arousals were noted as: 13 were spontaneous, 10 were associated with PLMs, 199 were associated with respiratory events.   Audio and video analysis did not show any abnormal or unusual movements, behaviors, phonations or vocalizations.  Snoring was noted.   EKG was in keeping with  normal sinus rhythm (NSR).  RESPIRATORY ANALYSIS:  There were a total of 200 respiratory events:  94 obstructive apneas, 0 central apneas and 0 mixed apneas with a total of 94 apneas and an apnea index (AI) of 19. /hour. There were 106 hypopneas with a hypopnea index of 21.4 /hour. The patient also had 0 respiratory event related arousals (RERAs).      The total APNEA/HYPOPNEA INDEX (AHI) was 40.4/hour and the total RESPIRATORY DISTURBANCE INDEX was 40.4 /hour.  26 events occurred in REM sleep and 204 events in NREM. The REM AHI was 22.3 /hour, versus a non-REM AHI of 46.. The patient spent 208.5 minutes of total sleep time in the supine position and 89 minutes in non-supine.. The supine AHI was 56.4 versus a non-supine AHI of 2.7.  OXYGEN SATURATION & C02:  The Wake baseline 02 saturation was 97%, with the lowest being 79%. Time spent below 89% saturation equaled 105 minutes.   PERIODIC LIMB MOVEMENTS:   The patient had a total of 71 Periodic Limb Movements.  The Periodic Limb Movement (PLM) index was 14.3 and the PLM Arousal index was 2./hour.   IMPRESSION:  1.  Severe OSA with an AHI = 40.4.   OSA was worse while supine. 2.  Mild periodic limb movements of sleep with little impact on sleep 3.  She had a delayed sleep onset which could be due to a 'first night effect' in the sleep lab.  If persistent at home after correction of the OSA,  consider a sleep aid.     RECOMMENDATIONS:  1. Advise full-night, attended, CPAP titration study to optimize therapy.  If unable to tolerate CPAP, consider positional therapy, an oral appliance or ENT consultation for surgery.   2. Avoid sedative-hypnotics which may worsen sleep apnea, alcohol and tobacco (as applicable). 3. Advise patient to avoid driving or operating hazardous machinery when sleepy. 4. A follow up appointment will be scheduled in the Sleep Clinic at [facility]. The referring provider will be notified of the results.       I certify that I have reviewed the entire raw data recording prior to the issuance of this report in accordance with the Standards of Accreditation of the American Academy of Sleep Medicine (AASM)    Juanell Fairly, PhD Diplomat, American Board of Psychiatry and Neurology (Neurology and Sleep Medicine)     Demographics and Medical History           Name: Carmelite, Violet Age: 53 BMI: 67. Interp Physician: Arlice Colt, MD  DOB: 1945-11-18 Ht-IN: 68 CM: 171 Referred By: Valaria Good. Karle Starch, MD  Pt. Tag:  Wt-LB: 174 KG: 79 Tested By: Lorelee Cover, RPSGT  Pt. #: [id number] Sex: Female Scored By: Arelia Longest, RPSGT  Bed Tag: [id number] Race: Caucasian   Sleep Summary    Sleep Time Statistics Minutes Hours    Time in Bed 446    7.4    Total Sleep Time 297    5.0    Total Sleep Time NREM 227    3.8    Total Sleep Time REM 70    1.2    Sleep Onset 105    1.8    Wake After Sleep Onset 44    0.7    Wake After Sleep Period 0    0.0    Latency Persistent Sleep 126    2.1    Sleep Efficiency 66.6 Percent    Lights out 22:26     Lights on 05:52    Sleep Disruption Events Count Index    Arousals 222 44.8    Awakenings 0 0    Arousals + Awakenings 222 44.8    REM Awakenings 0 0.0     Sleep Stage Statistics Wake N1 N2 N3 REM    Percent Stage to SPT 12.9  5.9  60.7  0.0  20.5  Percent   Sleep Period Time in Stage 44 20 207 0 70 Minutes   Latency to Stage  105 123.5 0 259 Minutes   Percent Stage to TST  6.7 69.7 0 23.6 Percent   EKG Summary          EKG Statistics         Heart Rate, Wake 100.5 BPM  TST Epochs in HR Interval 0 < 29   Heart Rate, Steady Sleep Avg 93 BPM   0 30-59   PAC Events 0 Count   0 60-79   PVC Events 0 Count   541 80-99   Bradycardia 0 Count   53 100-119   Tachycardia 0 Count   0 120-139        0 140-159    NREM REM   0 > 160   Shortest R-R .6 .6       Longest R-R .7 .7          Respiration Summary  Event Statistics Total  With Arousal  With  Awakening    Count Index  Count Index  Count Index  Apneas, Total 94 19.  93 18.8   0 0.0    Hypopneas, Total 106 21.4  106 21.4   0 0.0    Apnea + Hypopnea Index 200 40.4   199 40.2   0 0.0    Apneas, Supine 91 26.2     Apneas, Non Supine 3 2.     Hypopneas, Supine 105 30.2     Hypopneas, Non Supine 1 .7     % Sleep Apnea 12.3 Percent     % Sleep Hypopnea 15.6 Percent    Oximetry Statistics       SpO2, Mean Wake 97 Percent     SpO2, Minimum 79 Percent     SpO2, Max 98 Percent     SpO2, Mean 92 Percent            Desaturation Index, REM 18.9  Index     Desaturation Index, NREM 47.0  Index     Desaturation Index, Total 40.4  Index             SpO2 Intervals > 89% 80-89% 70-79% 60-69% 50-59% 40-49% 30-39% < 30%  297 Percent Sleep Time 62.1 37 0.8 0 0 0 0 0  Body Position Statistics   Back Side L Side R Side Prone    Total Sleep Time   208.5 88.5 4.5 84 0 Minutes   Percent Time to TST   70.2  29.8  1.5  28.3  0.0  Percent   Number of Events   196 4.0 0 4 0 Count   Number of Apneas   91 3 0 3 0 Count   Number of Hypopneas   105 1 0 1 0 Count   Apnea Index   26.2  2.0  0.0  2.1  0.0  Index   Hypopnea Index   30.2  0.7  0.0  0.7  0.0  Index   Apnea + Hypopnea Index   56.4  2.7  0.0  2.9  0.0  Index  Respiration Events    Non REM, Pre Rx Statistics Non Supine  Supine    Central Mixed Obstr  Central Mixed Obstr   Apneas 0 0 0  0 0 72 Count  Apneas, Minimum SpO2 0 0 0  0 0 79 Percent     Hypopneas 0 0 1  0 0 101 Count  Hypopneas, Minimum SpO2 0 0 90  0 0 80 Percent     Apnea + Hypopneas Index 0.0 0.0 1.1  0.0 0.0 60.7 Index    REM, Pre Rx Statistics Non Supine  Supine    Central Mixed Obstr  Central Mixed Obstr   Apneas 0 0 3  0 0 19 Count  Apneas, Minimum SpO2 0 0 92  0 0 83 Percent     Hypopneas 0 0 0  0 0 4 Count  Hypopneas, Minimum SpO2 0 0 0  0 0 87 Percent     Apnea + Hypopnea Index 0.0 0.0 5.5  0.0 0.0 36.8 Index  Leg Movement Summary     PLM Non REM (Incl. Wake) REM Total    No Arousal Arousal Wake No Arousal Arousal Wake No Arousal Arousal Wake Total   Isolated 9 0 0 0 0 0 9 0 0 9    PLMS 61 10 0 0 0 0 61 10 0 71    Total 70 10 0 0 0 0 70 10 0 80   PLM Statistics PLMS Total     Count Index  Count Index    PLM 71 14.3 80 16.2     PLM with Arousal 10 2. 10 2.0     PLM, with Wake 0 0 0 0.0     PLM, Arousal + Wake 10 2.0 10 2.0     PLM, No Arousal 61 12.3  70 14.1     PLM, Non REM 71 18.8  80 21.1     PLM, REM 0 0.0  0 0.0     Technician Comments: Patient came in for a routine nocturnal Polysomnogram with possible CPAP titration. Patient was scored using the 4% rule. Patient did not meet the split night criteria due to lack of sleep events before cut off time. Patient had a very difficult time initiating and maintaining sleep during the first half of the study. Patient was observed to sleep both supine and lateral positions. Patient did snore loudly especially supine sleeping. Patient did show most of her sleep apnea events while she was in REM sleep and or primarily supine.

## 2016-05-28 ENCOUNTER — Telehealth: Payer: Self-pay | Admitting: *Deleted

## 2016-05-28 NOTE — Telephone Encounter (Signed)
-----   Message from Britt Bottom, MD sent at 05/27/2016  6:09 PM EDT ----- Maria Brennan, Her PSG showed severe OSA and CPAP is indicated.      She had trouble falling asleep, so we can do APAP range 5-18 cm H2O  Heated humidifier with DME company of choice.   If insurance prefers in-lab, we can order a CPAP titration

## 2016-05-28 NOTE — Telephone Encounter (Signed)
I have spoken with Maria Brennan this morning and per RAS, advised PSG shows severe OSA.  Offered referral to a dme co. for in-home autopap, as she had difficulty falling asleep in our office.  She declined at this time, stating she would like to check with her ins. company to see what her oop for a cpap machine would be.  I have explained dme will do this for her, but she would still like to speak with her ins. co. before scheduling autopap.  Sts. insurance will not cover Lunesta.  I have offered to speak with RAS regarding a med change, but she requested appt. to speak with him.  Appt. given/fim

## 2016-06-18 ENCOUNTER — Encounter: Payer: Self-pay | Admitting: Neurology

## 2016-06-18 ENCOUNTER — Ambulatory Visit (INDEPENDENT_AMBULATORY_CARE_PROVIDER_SITE_OTHER): Payer: Medicare Other | Admitting: Neurology

## 2016-06-18 VITALS — BP 112/68 | HR 78 | Resp 18 | Ht 67.75 in | Wt 178.2 lb

## 2016-06-18 DIAGNOSIS — G4700 Insomnia, unspecified: Secondary | ICD-10-CM

## 2016-06-18 DIAGNOSIS — G4761 Periodic limb movement disorder: Secondary | ICD-10-CM

## 2016-06-18 DIAGNOSIS — G4719 Other hypersomnia: Secondary | ICD-10-CM

## 2016-06-18 DIAGNOSIS — G4733 Obstructive sleep apnea (adult) (pediatric): Secondary | ICD-10-CM

## 2016-06-18 DIAGNOSIS — G2581 Restless legs syndrome: Secondary | ICD-10-CM

## 2016-06-18 MED ORDER — HYDROCODONE-ACETAMINOPHEN 5-325 MG PO TABS: 1.0000 | ORAL_TABLET | Freq: Every day | ORAL | 0 refills | Status: DC

## 2016-06-18 NOTE — Progress Notes (Signed)
[facility]  PATIENT: Maria Brennan DOB: Feb 01, 1946  REFERRING DOCTOR OR PCP:  [name] SOURCE: patient, notes form [name], labwork from [facility]  _________________________________   HISTORICAL  CHIEF COMPLAINT:  Chief Complaint  Patient presents with  . Sleep Apnea    Pt. would like to discuss results of sleep study.  Sts. she has continued Ambien for insomnia, as ins. will not cover Lunesta.  Was not able to tolerate Gabapentin--caused excessive drowsiness.  Sts. she has been taking her husband's Hydrocodone at night and feels it helps pain and helps her sleep better/fim  . 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   On recent PSG she was found to have severe OSA (AHI = 40).     OSA:   She has severe OSA.   We had a long discussion about sleep apnea and need for treatment. Hopefully, the sleep apnea is treated she will feel improved fatigue, sleepiness.    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      She tries zolpidem 5 mg without much benefit but zolpidem 10 mg helps most of the time but makes her groggy.   Taking Ambien 5 mg with hydrocodone helps best (she has lumbar and cervical DJD).  She could not tolerate gabapentin.    Hydroxyzine sometimes helps but she can have a hangover if she takes  RLS:   She also has RLS when she lays down at night.  He sleep study also showed mild periodic limb movements of sleep. She can't get comfortable and has to move after a couple minutes.  Sometimes she gets out of bed and walks around and then goes back to bed.    Zolpidem will help the RLS.      She has lost some weight on Victoza.   She smokes a little (most nights).    She has depression and OCD and is on Cymbalta with benefit.    EPWORTH SLEEPINESS SCALE  On a scale of 0 - 3 what is the chance of dozing:  Sitting and  Reading:   3 Watching TV:    3 Sitting inactive in a public place: 1 Passenger in car for one hour: 3 Lying down to rest in the afternoon: 3 Sitting and talking to someone: 0 Sitting quietly after lunch:  3 In a car, stopped in traffic:  0  Total (out of 24):   16/24 (moderate sleepiness)  RESULTS OF PSG  IMPRESSION:  1.  Severe OSA with an AHI = 40.4.   OSA was worse while supine. 2.  Mild periodic limb movements of sleep with little impact on sleep 3.  She had a delayed sleep onset which could be due to a 'first night effect' in the sleep lab.  If persistent at home after correction of the OSA, consider a sleep aid.     RECOMMENDATIONS:  1. Advise full-night, attended, CPAP titration study to optimize therapy.  If unable to tolerate CPAP, consider positional therapy, an oral appliance or ENT consultation for surgery.   2. Avoid sedative-hypnotics which may worsen sleep apnea, alcohol and tobacco (as applicable). 3. Advise patient to avoid driving or operating hazardous machinery when sleepy. 4. A follow up appointment will be scheduled in the Sleep Clinic at [facility]. The referring provider will be notified of the results.  REVIEW OF SYSTEMS: Constitutional: No fevers, chills, sweats, or change in appetite Eyes: No visual changes, double vision, eye pain Ear, nose and throat: No hearing loss, ear pain, nasal congestion, sore throat Cardiovascular: No chest pain, palpitations Respiratory: No shortness of breath at rest or with exertion.   No wheezes GastrointestinaI: No nausea, vomiting, diarrhea, abdominal pain, fecal incontinence Genitourinary: No dysuria, urinary retention or frequency.  No nocturia. Musculoskeletal: No neck pain, back pain Integumentary: No rash, pruritus, skin lesions Neurological: as above Psychiatric: No depression at this time.  No anxiety Endocrine: No palpitations, diaphoresis, change in appetite, change in weigh  or increased thirst Hematologic/Lymphatic: No anemia, purpura, petechiae. Allergic/Immunologic: No itchy/runny eyes, nasal congestion, recent allergic reactions, rashes  ALLERGIES: Allergies  Allergen Reactions  . Rosuvastatin Other (See Comments)  . Codeine Itching and Nausea Only    HOME MEDICATIONS:  Current Outpatient Prescriptions:  .  atorvastatin (LIPITOR) 40 MG tablet, Take 40 mg by mouth every evening. , Disp: , Rfl: 0 .  Biotin 10 MG TABS, Take by mouth., Disp: , Rfl:  .  Cholecalciferol (VITAMIN D3) 2000 units TABS, Take 2,000 Units by mouth daily., Disp: , Rfl:  .  Cyanocobalamin (VITAMIN B-12 PO), Take 1 tablet by mouth daily., Disp: , Rfl:  .  docusate sodium (COLACE) 100 MG capsule, Take 1 capsule (100 mg total) by mouth 2 (two) times daily., Disp: 10 capsule, Rfl: 0 .  DULoxetine (CYMBALTA) 30 MG capsule, Take 30 mg by mouth 2 (two) times daily., Disp: , Rfl: 0 .  GLUCOSAMINE-CHONDROITIN PO, Take 1 tablet by mouth 2 (two) times daily., Disp: , Rfl:  .  Liraglutide (VICTOZA) 18 MG/3ML SOPN, Inject 1.8 mg into the skin daily., Disp: , Rfl:  .  magnesium oxide (MAG-OX) 400 MG tablet, Take 400 mg by mouth daily., Disp: , Rfl:  .  metFORMIN (GLUCOPHAGE) 500 MG tablet, Take 500 mg by mouth daily., Disp: , Rfl: 0 .  omeprazole (PRILOSEC) 40 MG capsule, Take 40 mg by mouth every evening. , Disp: , Rfl: 0 .  polyethylene glycol (MIRALAX / GLYCOLAX) packet, Take 17 g by mouth 2 (two) times daily., Disp: 14 each, Rfl: 0 .  Potassium 99 MG TABS, Take 99 mg by mouth daily., Disp: , Rfl:  .  triamterene-hydrochlorothiazide (MAXZIDE-25) 37.5-25 MG tablet, Take 0.5 tablets by mouth daily. , Disp: , Rfl:  .  Turmeric 500 MG TABS, Take by mouth., Disp: , Rfl:  .  zolpidem (AMBIEN) 5 MG tablet, Take 5 mg by mouth at bedtime., Disp: , Rfl: 1 .  eszopiclone (LUNESTA) 2 MG TABS tablet, Take 1 tablet (2 mg total) by mouth at bedtime as needed for sleep. Take immediately before bedtime  (Patient not taking: Reported on 06/18/2016), Disp: 30 tablet, Rfl: 5 .  gabapentin (NEURONTIN) 300 MG capsule, One or two at night (Patient not taking: Reported on 06/18/2016), Disp: 60 capsule, Rfl: 11 .  HYDROcodone-acetaminophen (NORCO/VICODIN) 5-325 MG tablet, Take 1 tablet by mouth at bedtime., Disp: 30 tablet, Rfl: 0  PAST MEDICAL HISTORY: Past Medical History:  Diagnosis Date  . Arthritis    oa  . Diabetes mellitus without complication ([place])   . Family history of adverse reaction to anesthesia    mother and sister slow to awaken   . GERD (gastroesophageal reflux disease)   . History of hiatal hernia   . Hypertension   . MVP (mitral valve prolapse)   . PONV (postoperative nausea and vomiting) yrs ago  PAST SURGICAL HISTORY: Past Surgical History:  Procedure Laterality Date  . ABDOMINAL HYSTERECTOMY     partial  . BREAST SURGERY Bilateral    implants  . rotator  cuff Right   . 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 Heart disease Mother   . Diabetes Mother   . Diabetes Father   . Non-Hodgkin's lymphoma Father     SOCIAL HISTORY:  Social History   Social History  . Marital status: Married    Spouse name: N/A  . Number of children: N/A  . Years of education: N/A   Occupational History  . Not on file.   Social History Main Topics  . Smoking status: Current Every Day Smoker    Packs/day: 0.50    Years: 53.00    Types: Cigarettes  . Smokeless tobacco: Never Used  . Alcohol use No  . Drug use: No  . Sexual activity: Not on file   Other Topics Concern  . Not on file   Social History Narrative  . No narrative on file     PHYSICAL EXAM  Vitals:   06/18/16 1016  BP: 112/68  Pulse: 78  Resp: 18  Weight: 178 lb 3.2 oz (80.8 kg)  Height: 5' 7.75"  (1.721 m)    Body mass index is 27.3 kg/m.   General: The patient is well-developed and well-nourished and in no acute distress  HEENT:  [place]/AT, pharynx is Mallampati 2.     Neurologic Exam  Mental status: The patient is alert and oriented x 3 at the time of the examination. The patient has apparent normal recent and remote memory, with an apparently normal attention span and concentration ability.   Speech is normal.  Cranial nerves: Extraocular movements are full.  Facial strength and sensation is normal.  Trapezius and sternocleidomastoid strength is normal. No dysarthria is noted.  The tongue is midline, and the patient has symmetric elevation of the soft palate. Mild reduced left hearing.  Motor:  Muscle bulk is normal.   Tone is normal. Strength is  5 / 5 in all 4 extremities.   Sensory: Sensory testing is intact to soft touch and vibration sensation in all 4 extremities.  Coordination: Cerebellar testing reveals good finger-nose-finger and heel-to-shin bilaterally.  Gait and station: Station is normal.   Gait is normal. Tandem gait is normal. Romberg is negative.   Reflexes: Deep tendon reflexes are symmetric and normal bilaterally.       DIAGNOSTIC DATA (LABS, IMAGING, TESTING) - I reviewed patient records, labs, notes, testing and imaging myself where available.  Lab Results  Component Value Date   WBC 7.6 05/12/2016   HGB 9.7 (L) 11/21/2015   HCT 35.9 05/12/2016   MCV 90 05/12/2016   PLT 394 (H) 05/12/2016      Component Value Date/Time   NA 136 11/21/2015 0429   K 4.5 11/21/2015 0429   CL 105 11/21/2015 0429   CO2 26 11/21/2015 0429   GLUCOSE 150 (H) 11/21/2015 0429   BUN 18 11/21/2015 0429   CREATININE 0.89 11/21/2015 0429   CALCIUM 8.9 11/21/2015 0429   GFRNONAA >60 11/21/2015 0429   GFRAA >60 11/21/2015 0429   No results found for: CHOL, HDL, LDLCALC, LDLDIRECT, TRIG, CHOLHDL Lab Results  Component Value Date   HGBA1C  6.0 (H) 11/09/2015        ASSESSMENT AND PLAN  OSA (obstructive sleep apnea) - Plan: Cpap titration  Insomnia, unspecified type  Excessive daytime sleepiness  Periodic limb movement  Restless leg syndrome   1.    For her insomnia and periodic limb movements of sleep, she will take a combination of Ambien 5 mg and hydrocodone 5 mg nightly. 2.    She has severe OSA and we had a long discussion about her obstructive sleep apnea. She is concerned that she will have trouble with CPAP as she is claustrophobic. I showed her and less restrictive. She thinks she may be able to use CPAP with nasal pillow style masks or similar. We will set up a CPAP titration 3.    She will return to see me in 4 months or sooner if there are new or worsening neurologic symptoms.  Richard A. Felecia Shelling, MD, PhD 04/11/3341, 5:68 PM Certified in Neurology, Clinical Neurophysiology, Sleep Medicine, Pain Medicine and Neuroimaging  [facility] [address] [place], [place] [address] [phone]

## 2016-07-07 ENCOUNTER — Ambulatory Visit (INDEPENDENT_AMBULATORY_CARE_PROVIDER_SITE_OTHER): Payer: Medicare Other | Admitting: Neurology

## 2016-07-07 DIAGNOSIS — G4733 Obstructive sleep apnea (adult) (pediatric): Secondary | ICD-10-CM

## 2016-07-14 NOTE — Progress Notes (Signed)
PATIENT'S NAME:  Maria, Brennan DOB:      [birth year]      MR#:    [id number]     DATE OF RECORDING: 07/07/2016 REFERRING M.D.:  Valaria Good. Karle Starch, MD Study Performed:   CPAP  Titration HISTORY:  Patient had an in lab study at [facility] on 05/26/2016 which showed the total APNEA/HYPOPNEA INDEX (AHI) was 40.4/hour and the total RESPIRATORY DISTURBANCE INDEX was 40.4 /hour.  26 events occurred in REM sleep and 204 events in NREM. The REM AHI was 22.3 /hour, versus a non-REM AHI of 46. The patient spent 208.5 minutes of total sleep time in the supine position and 89 minutes in non-supine. The supine AHI was 56.4 versus a non-supine AHI of 2.7. The Wake baseline 02 saturation was 97%, with the lowest being 79%. Time spent below 89% saturation equaled 105 minutes.  Restless leg, snoring, EDS, overweight, insomnia, diabetes and depression  The patient endorsed the Epworth Sleepiness Scale at 16/24 points.    The patient's weight 174 pounds with a height of 68 (inches), resulting in a BMI of 26.4 kg/m2.  The patient's neck circumference measured 16 inches.  CURRENT MEDICATIONS: Atorvastatin, Biotin, Cholecalciferol, Cyanocobalamin, Docusate sodium, Duloxetine, Eszopiclone, Gabapentin, Glucosamine, Liraglutide, Magnesium oxide, Metformin, Omeprazole, Polyethylene, Potassium, Triamterene-Hydrochlorothiazide, Tumeric    PROCEDURE:  This is a multichannel digital polysomnogram utilizing the SomnoStar 11.2 system.  Electrodes and sensors were applied and monitored per AASM Specifications.   EEG, EOG, Chin and Limb EMG, were sampled at 200 Hz.  ECG, Snore and Nasal Pressure, Thermal Airflow, Respiratory Effort, CPAP Flow and Pressure, Oximetry was sampled at 50 Hz. Digital video and audio were recorded.       CPAP was initiated at 0 cmH20 with heated humidity per AASM split night standards and pressure was advanced to 7/7cmH20 because of hypopneas, apneas and desaturations.  At a PAP pressure of 7 cmH20, there was a  reduction of the AHI to 0 with improvement of the above symptoms of obstructive sleep apnea.    Lights Out was at 22:52 and Lights On at 05:22. Total recording time (TRT) was 390 minutes, with a total sleep time (TST) of 245 minutes. The patient's sleep latency was 99 minutes with 0.5 minutes of wake time after sleep onset. REM latency was 324.5 minutes.  The sleep efficiency was 62.8 %.    SLEEP ARCHITECTURE: WASO (Wake after sleep onset) was 81 minutes.  There were 41.5 minutes in Stage N1, 202 minutes Stage N2, 0 minutes Stage N3 and 1.5 minutes in Stage REM.  The percentage of Stage N1 was 16.9%, Stage N2 was 82.4%, Stage N3 was 0% and Stage R (REM sleep) was .6%. The sleep architecture was notable for absence of Stage N3 and near absence or REM sleep.   Audio and video analysis did not show any abnormal or unusual movements, behaviors, phonations or vocalizations.   EKG showed normal sinus rhythm (NSR) with frequent PVC's.  AROUSALS:  The arousals were noted as: 35 were spontaneous, 3 were associated with PLMs, 0 were associated with respiratory events.   RESPIRATORY ANALYSIS:  The patient was titrated to CPAP +7 cm.   There was a total of 0 respiratory events: 0 obstructive apneas, 0 central apneas and 0 mixed apneas with a total of 0 apneas and an apnea index (AI) of 0 /hour. There were 0 hypopneas with a hypopnea index of 0/hour. The patient also had 0 respiratory event related arousals (RERAs).      The  total APNEA/HYPOPNEA INDEX  (AHI) was 0 /hour and the total RESPIRATORY DISTURBANCE INDEX was 0 .hour  0 events occurred in REM sleep and 0 events in NREM. The REM AHI was 0 /hour versus a non-REM AHI of 0 /hour.  The patient spent 103.5 minutes of total sleep time in the supine position and 142 minutes in non-supine. The supine AHI was 0.0, versus a non-supine AHI of 0.0.  OXYGEN SATURATION:  The baseline 02 saturation was 97%, with the lowest being 92%. Time spent below 89% saturation  equaled 0 minutes.  PERIODIC LIMB MOVEMENTS:    The patient had a total of 22 Periodic Limb Movements. The Periodic Limb Movement (PLM) index was 5.4 and the PLM Arousal index was .7 /hour.  The Technologist noted the patient was fitted with a Resmed AirFit P10 (Small) apparatus.  DIAGNOSIS 1. Severe obstructive Sleep Apnea responsive to CPAP 2. Mild Periodic Limb Movement Disorder  3. Reduced sleep efficiency, which could be secondary to sleeping in laboratory environment, medications, or mood disorders.   PLANS/RECOMMENDATIONS: 1. Obstructive sleep apnea: Recommend a CPAP of 7 cm H2O with a heated humidifier, with followup at 2 to 3 months to assess response.  She used a Resmed Airfit P10 (small) apparatus during the study.  a. Weight loss if appropriate. b. Avoidance of medications with muscle relaxant properties. c. Avoidance of ingestion of alcohol prior to sleeping. d. Avoiding sleeping in the supine position (on one's back). e. Avoiding driving when sleepy. 2. If poor sleep initiation is typical, consider a sleep aid.    A follow up appointment will be scheduled in the Sleep Clinic at [facility].   Please call [phone] with any questions.      I certify that I have reviewed the entire raw data recording prior to the issuance of this report in accordance with the Standards of Accreditation of the American Academy of Sleep Medicine (AASM)     Alan Ripper Diplomat, American Board of Psychiatry and Neurology  Diplomat, American Board of Sleep Medicine     Demographics and Medical History           Name: Maria, Brennan Age: 71 BMI: 26.4 Interp Physician: Arlice Colt, MD  DOB: 05/14/1945 Ht-IN: 68 CM: 173 Referred By: Valaria Good. Karle Starch, MD  Pt. Tag:  Wt-LB: 174 KG: 79 Tested ByArelia Longest, RPSGT  Pt. #: [id number] Sex: Female Scored By: Arelia Longest, RPSGT  Bed Tag: [id number] Race: Caucasian  Sleep Summary    Sleep Time Statistics Minutes Hours     Time in Bed 390    0.0    Total Sleep Time 245    0.0    Total Sleep Time NREM 243.5    0.0    Total Sleep Time REM 1.5    0.0    Sleep Onset 63.5 !C8 Is Not In Table    [facility] After Sleep Onset 81 !C9 Is Not In Table    Wake After Sleep Period 0.5    0.0    Latency Persistent Sleep 99    6.5    Sleep Efficiency 62.8 Percent    Lights out 22:52     Lights on 05:22    Sleep Disruption Events Count Index    Arousals 45 11.    Awakenings 0 0    Arousals + Awakenings 45 11.    REM Awakenings 0 0.0     Sleep Stage Statistics Wake N1 N2 N3 REM    Percent  Stage to SPT 24.8  12.7  62.0  0.0  0.5  Percent   Sleep Period Time in Stage 81 41.5 202 0 1.5 Minutes   Latency to Stage  63.5 78 0 324.5 Minutes   Percent Stage to TST  16.9 82.4 0 .6 Percent   EKG Summary          EKG Statistics         Heart Rate, Wake 101.5 BPM  TST Epochs in HR Interval 0 < 29   Heart Rate, Steady Sleep Avg 90 BPM   0 30-59   PAC Events 0 Count   4 60-79   PVC Events 0 Count   437 80-99   Bradycardia 0 Count   49 100-119   Tachycardia 0 Count   0 120-139        0 140-159    NREM REM   0 > 160   Shortest R-R .6 .7       Longest R-R .8 .8           Respiration Summary  Event Statistics Total  With Arousal  With Awakening    Count Index  Count Index  Count Index   Apneas, Total 0 0  0 0.0   0 0.0    Hypopneas, Total 0 0  0 0.0   0 0.0    Events (Apneas + Hypopneas) 0 0.0   0 0.0   0 0.0    Apneas, Supine 0 0     Apneas, Non Supine 0 0     Hypopneas, Supine 0 0     Hypopneas, Non Supine 0 0     % Sleep Apnea 0 Percent     % Sleep Hypopnea 0 Percent    Oximetry Statistics       SpO2, Mean Wake 97 Percent     SpO2, Minimum 92 Percent     SpO2, Max 98 Percent     SpO2, Mean 96 Percent            Desaturation Index, REM 0.0  Index     Desaturation Index, NREM 0.2  Index     Desaturation Index, Total 0.2  Index            SpO2 Intervals > 89% 80-89% 70-79% 60-69% 50-59% 40-49%  30-39% < 30%  245 Percent Sleep Time 100 0 0 0 0 0 0 0  Body Position Statistics   Back Side L Side R Side Prone    Total Sleep Time   103.5 141.5 89.5 52 0 Minutes   Percent Time to TST   42.2  57.8  36.5  21.2  0.0  Percent   Number of Events   0 0.0 0 0 0 Count   Number of Apneas   0 0 0 0 0 Count   Number of Hypopneas   0 0 0 0 0 Count   Apnea Index   0.0  0.0  0.0  0.0  0.0  Index   Hypopnea Index   0.0  0.0  0.0  0.0  0.0  Index   Apnea + Hypopnea Index   0.0  0.0  0.0  0.0  0.0  Index    Non REM, Post Rx Statistics Non Supine  Supine    Central Mixed Obstr  Central Mixed Obstr   Apneas 0 0 0  0 0 0 Count  Apneas, Minimum SpO2 0 0 0  0 0 0 Percent  Hypopneas 0 0 0  0 0 0 Count  Hypopneas, Minimum SpO2 0 0 0  0 0 0 Percent     Apnea + Hypoapnea Index 0.0 0.0 0.0  0.0 0.0 0.0 Index    REM, Post Rx Statistics Non Supine  Supine    Central Mixed Obstr  Central Mixed Obstr   Apneas 0 0 0  0 0 0 Count  Apneas, Minimum SpO2 0 0 0  0 0 0 Percent     Hypopneas 0 0 0  0 0 0 Count  Hypopneas, Minimum SpO2 0 0 0  0 0 0 Percent     Apnea + Hypopnea Index 0.0 0.0 0.0  0.0 0.0 0.0 Index   Leg Movement Summary    PLM Non REM (Incl. Wake) REM Total    No Arousal Arousal Wake No Arousal Arousal Wake No Arousal Arousal Wake Total   Isolated 41 7 0 0 0 0 41 7 0 48    PLMS 19 3 0 0 0 0 19 3 0 22    Total 60 10 0 0 0 0 60 10 0 70   PLM Statistics PLMS Total     Count Index Count Index    PLM 22 5.4 70 17.1     PLM with Arousal 3 .7 10 2.4     PLM, with Wake 0 0 0 0.0     PLM, Arousal + Wake 3 0.7 10 2.4     PLM, No Arousal 19 4.7  60 14.7     PLM, Non REM 22 5.4  70 17.2     PLM, REM 0 0.0  0 0.0     Technician Comments: Patient came in for a CPAP titration study returning from her PSG on 05/26/16. Patient was fitted to the Resmed AirFit P10 (Small). Patient did really well with CPAP and she actually stated that it wasn't as bad as she thought it would  be. Patient did however have a difficult time initiating and maintaining sleep during the night. Patient was seen sleeping supine and both lateral positions. Patient did seem to be titrated and patients AHI was dramatically lower even on lower levels of CPAP. Patient did use the restroom 2x during the sleep study.    Patient had an in lab study at [facility] on 05/26/2016 which showed the total APNEA/HYPOPNEA INDEX (AHI) was 40.4/hour and the total RESPIRATORY DISTURBANCE INDEX was 40.4 /hour.  26 events occurred in REM sleep and 204 events in NREM. The REM AHI was 22.3 /hour, versus a non-REM AHI of 46. The patient spent 208.5 minutes of total sleep time in the supine position and 89 minutes in non-supine. The supine AHI was 56.4 versus a non-supine AHI of 2.7. The Wake baseline 02 saturation was 97%, with the lowest being 79%. Time spent below 89% saturation equaled 105 minutes.

## 2016-07-15 ENCOUNTER — Other Ambulatory Visit: Payer: Self-pay | Admitting: *Deleted

## 2016-07-15 DIAGNOSIS — G4733 Obstructive sleep apnea (adult) (pediatric): Secondary | ICD-10-CM

## 2016-07-17 ENCOUNTER — Telehealth: Payer: Self-pay | Admitting: Neurology

## 2016-07-17 MED ORDER — HYDROCODONE-ACETAMINOPHEN 5-325 MG PO TABS: 1.0000 | ORAL_TABLET | Freq: Every day | ORAL | 0 refills | Status: DC

## 2016-07-17 NOTE — Telephone Encounter (Signed)
Pt request refill for HYDROcodone-acetaminophen (NORCO/VICODIN) 5-325 MG tablet . Pt aware clinic closes at noon tomorrow

## 2016-07-17 NOTE — Telephone Encounter (Signed)
Rx. awaiting RAS sig/fim 

## 2016-07-18 NOTE — Telephone Encounter (Signed)
Rx. up front [facility]/fim 

## 2016-07-21 ENCOUNTER — Telehealth: Payer: Self-pay | Admitting: Neurology

## 2016-07-21 NOTE — Telephone Encounter (Signed)
I have spoken with Ivin Booty at [facility], and refaxed CPAP order/fim

## 2016-07-21 NOTE — Telephone Encounter (Signed)
Ivin Booty with [facility] called in reference to not receiving prescription for cpap received everything else but that.  Please call

## 2016-09-22 ENCOUNTER — Telehealth: Payer: Self-pay | Admitting: Neurology

## 2016-09-22 MED ORDER — HYDROCODONE-ACETAMINOPHEN 5-325 MG PO TABS: 1.0000 | ORAL_TABLET | Freq: Every day | ORAL | 0 refills | Status: DC

## 2016-09-22 NOTE — Telephone Encounter (Signed)
Rx. up front [facility]/fim 

## 2016-09-22 NOTE — Telephone Encounter (Signed)
Pt requested refill for HYDROcodone-acetaminophen (NORCO/VICODIN) 5-325 MG tablet

## 2016-09-22 NOTE — Telephone Encounter (Signed)
Rx. awaiting RAS sig/fim 

## 2016-09-25 ENCOUNTER — Telehealth: Payer: Self-pay | Admitting: Neurology

## 2016-09-25 NOTE — Telephone Encounter (Signed)
[facility] called in reference to patients HYDROcodone-acetaminophen (NORCO/VICODIN) 5-325 MG tablet.  Per [facility] opioid guidelines state if patient has not had medication filled in past 30 days they require a DX code in order to dispense the entire prescription.  If no DX code given pharmacist is only able to dispense 5 pills

## 2016-09-25 NOTE — Telephone Encounter (Signed)
I have spoken with [facility] and given dx. code of M19.90 (arthritis)/fim

## 2016-09-30 ENCOUNTER — Telehealth: Payer: Self-pay | Admitting: Neurology

## 2016-09-30 NOTE — Telephone Encounter (Signed)
Maria Brennan with [facility] called office to scheduled cpap compliance appointment with Dr. Felecia Shelling between Aug 20-Sept10th.  I viewed schedule and have no opening to put patient.  Please call Maria Brennan @ [phone]

## 2016-09-30 NOTE — Telephone Encounter (Signed)
I have spoken with Maria Brennan this morning and given appt. for CPAP compliance visit./fim

## 2016-11-20 ENCOUNTER — Ambulatory Visit (INDEPENDENT_AMBULATORY_CARE_PROVIDER_SITE_OTHER): Payer: Medicare Other | Admitting: Neurology

## 2016-11-20 ENCOUNTER — Telehealth: Payer: Self-pay | Admitting: Neurology

## 2016-11-20 ENCOUNTER — Encounter: Payer: Self-pay | Admitting: Neurology

## 2016-11-20 VITALS — BP 127/68 | HR 96 | Resp 20 | Ht 67.75 in | Wt 178.0 lb

## 2016-11-20 DIAGNOSIS — G4700 Insomnia, unspecified: Secondary | ICD-10-CM

## 2016-11-20 DIAGNOSIS — J328 Other chronic sinusitis: Secondary | ICD-10-CM | POA: Insufficient documentation

## 2016-11-20 DIAGNOSIS — G2581 Restless legs syndrome: Secondary | ICD-10-CM | POA: Diagnosis not present

## 2016-11-20 DIAGNOSIS — G4733 Obstructive sleep apnea (adult) (pediatric): Secondary | ICD-10-CM | POA: Diagnosis not present

## 2016-11-20 DIAGNOSIS — J329 Chronic sinusitis, unspecified: Secondary | ICD-10-CM | POA: Diagnosis not present

## 2016-11-20 MED ORDER — HYDROCODONE-ACETAMINOPHEN 5-325 MG PO TABS: 1.0000 | ORAL_TABLET | Freq: Every day | ORAL | 0 refills | Status: DC

## 2016-11-20 MED ORDER — ZOLPIDEM TARTRATE 5 MG PO TABS: 5.0000 mg | ORAL_TABLET | Freq: Every day | ORAL | 5 refills | Status: DC

## 2016-11-20 NOTE — Telephone Encounter (Signed)
Additional clinical information provided.  CT approved.  Order# [id number], for dates 11/20/16 thru 12/19/16./fim

## 2016-11-20 NOTE — Telephone Encounter (Signed)
I was unable to get the CT approved on my level I called [employer] and spoke to a [place] she was unable to get it approved on her level. The phone number for the peer to peer is [phone]. The member ID is [id number] DOB Feb 28, 1946. The case does close on Friday 11/28/16 at 4 pm.

## 2016-11-20 NOTE — Progress Notes (Signed)
[facility]  PATIENT: Maria Brennan DOB: 1945-04-20  REFERRING DOCTOR OR PCP:  [name] SOURCE: patient, notes form [name], labwork from [facility]  _________________________________   HISTORICAL  CHIEF COMPLAINT:  Chief Complaint  Patient presents with  . Sleep Apnea    Has had decreased usage of CPAP due to sinus infections/fim  . 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     OSA:    On 05/26/2016 PSG she was found to have severe OSA (AHI = 40).   She had her CPAP titration in April 2018 and was titrated to a pressure of +7 cm.   Initially, she did well with OSA and used it nightly.  She fell asleep more rapidly with it and her snoring resolved. She was slightly less sleepy during the daytime.      However, the last month or two she has not used it much.   When she uses it, efficacy is good with an AHI = 3.6 on the 09/24/16 download (compliance was 97% at that time).   She notes rhinitis on the right.    She has been treated with 2 rounds of antibiotics for sinusitis but still has nasal drainage.     Insomnia:   She felt insomnia did better on CPAP and now she has more insomnia, sleep onset > maintenance.   She is on zolpidem 10 mg and feels it helps.     Taking Ambien 5 mg with hydrocodone and CBD oil helps her fall asleep the best.  She could not tolerate gabapentin.    Hydroxyzine sometimes helps but she can have a hangover if she takes  RLS:   She has RLS when she lays down and if in a car a long time.    He sleep study also showed mild periodic limb movements of sleep. She can't get comfortable and has to move after a couple minutes.  The combination of night time hydrocodone and Ambien has helped.    Other:   She is on hydrocodone nightly for her back pain and it also helps her RLS.       EPWORTH SLEEPINESS SCALE  On a scale of 0 - 3 what  is the chance of dozing:  Sitting and Reading:   3 Watching TV:    3 Sitting inactive in a public place: 1 Passenger in car for one hour: 3 Lying down to rest in the afternoon: 3 Sitting and talking to someone: 0 Sitting quietly after lunch:  3 In a car, stopped in traffic:  1  Total (out of 24):   17/24 (moderate sleepiness -- while not on CPAP)  RESULTS OF PSG  IMPRESSION:  1.  Severe OSA with an AHI = 40.4.   OSA was worse while supine. 2.  Mild periodic limb movements of sleep with little impact on sleep 3.  She had a delayed sleep onset which could be due to a 'first night effect' in the sleep lab.  If persistent at home after correction of the OSA, consider a sleep aid.     RECOMMENDATIONS:  1. Advise full-night, attended, CPAP titration study to optimize therapy.  If unable to tolerate CPAP, consider positional therapy, an oral appliance or ENT consultation for surgery.   2. Avoid sedative-hypnotics which may worsen sleep apnea, alcohol and tobacco (as applicable). 3. Advise patient to avoid driving or  operating hazardous machinery when sleepy. 4. A follow up appointment will be scheduled in the Sleep Clinic at [facility]. The referring provider will be notified of the results.       REVIEW OF SYSTEMS: Constitutional: No fevers, chills, sweats, or change in appetite Eyes: No visual changes, double vision, eye pain Ear, nose and throat: No hearing loss, ear pain, nasal congestion, sore throat Cardiovascular: No chest pain, palpitations Respiratory: No shortness of breath at rest or with exertion.   No wheezes GastrointestinaI: No nausea, vomiting, diarrhea, abdominal pain, fecal incontinence Genitourinary: No dysuria, urinary retention or frequency.  No nocturia. Musculoskeletal: No neck pain, back pain Integumentary: No rash, pruritus, skin lesions Neurological: as above Psychiatric: No depression at this time.  No anxiety Endocrine: No  palpitations, diaphoresis, change in appetite, change in weigh or increased thirst Hematologic/Lymphatic: No anemia, purpura, petechiae. Allergic/Immunologic: No itchy/runny eyes, nasal congestion, recent allergic reactions, rashes  ALLERGIES: Allergies  Allergen Reactions  . Rosuvastatin Other (See Comments)  . Codeine Itching and Nausea Only    HOME MEDICATIONS:  Current Outpatient Prescriptions:  .  atorvastatin (LIPITOR) 40 MG tablet, Take 40 mg by mouth every evening. , Disp: , Rfl: 0 .  Biotin 10 MG TABS, Take by mouth., Disp: , Rfl:  .  Cholecalciferol (VITAMIN D3) 2000 units TABS, Take 2,000 Units by mouth daily., Disp: , Rfl:  .  Cyanocobalamin (VITAMIN B-12 PO), Take 1 tablet by mouth daily., Disp: , Rfl:  .  docusate sodium (COLACE) 100 MG capsule, Take 1 capsule (100 mg total) by mouth 2 (two) times daily., Disp: 10 capsule, Rfl: 0 .  DULoxetine (CYMBALTA) 30 MG capsule, Take 30 mg by mouth 2 (two) times daily., Disp: , Rfl: 0 .  eszopiclone (LUNESTA) 2 MG TABS tablet, Take 1 tablet (2 mg total) by mouth at bedtime as needed for sleep. Take immediately before bedtime, Disp: 30 tablet, Rfl: 5 .  GLUCOSAMINE-CHONDROITIN PO, Take 1 tablet by mouth 2 (two) times daily., Disp: , Rfl:  .  HYDROcodone-acetaminophen (NORCO/VICODIN) 5-325 MG tablet, Take 1 tablet by mouth at bedtime., Disp: 30 tablet, Rfl: 0 .  magnesium oxide (MAG-OX) 400 MG tablet, Take 400 mg by mouth daily., Disp: , Rfl:  .  metFORMIN (GLUCOPHAGE) 500 MG tablet, Take 500 mg by mouth daily., Disp: , Rfl: 0 .  omeprazole (PRILOSEC) 40 MG capsule, Take 40 mg by mouth every evening. , Disp: , Rfl: 0 .  polyethylene glycol (MIRALAX / GLYCOLAX) packet, Take 17 g by mouth 2 (two) times daily., Disp: 14 each, Rfl: 0 .  Potassium 99 MG TABS, Take 99 mg by mouth daily., Disp: , Rfl:  .  triamterene-hydrochlorothiazide (MAXZIDE-25) 37.5-25 MG tablet, Take 0.5 tablets by mouth daily. , Disp: , Rfl:  .  Turmeric 500 MG TABS,  Take by mouth., Disp: , Rfl:  .  zolpidem (AMBIEN) 5 MG tablet, Take 5 mg by mouth at bedtime., Disp: , Rfl: 1  PAST MEDICAL HISTORY: Past Medical History:  Diagnosis Date  . Arthritis    oa  . Diabetes mellitus without complication ([place])   . Family history of adverse reaction to anesthesia    mother and sister slow to awaken   . GERD (gastroesophageal reflux disease)   . History of hiatal hernia   . Hypertension   . MVP (mitral valve prolapse)   . PONV (postoperative nausea and vomiting) yrs ago    PAST SURGICAL HISTORY: Past Surgical History:  Procedure Laterality Date  .  ABDOMINAL HYSTERECTOMY     partial  . BREAST SURGERY Bilateral    implants  . rotator  cuff Right   . 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 Heart disease Mother   . Diabetes Mother   . Diabetes Father   . Non-Hodgkin's lymphoma Father     SOCIAL HISTORY:  Social History   Social History  . Marital status: Married    Spouse name: N/A  . Number of children: N/A  . Years of education: N/A   Occupational History  . Not on file.   Social History Main Topics  . Smoking status: Current Every Day Smoker    Packs/day: 0.50    Years: 53.00    Types: Cigarettes  . Smokeless tobacco: Never Used  . Alcohol use No  . Drug use: No  . Sexual activity: Not on file   Other Topics Concern  . Not on file   Social History Narrative  . No narrative on file     PHYSICAL EXAM  Vitals:   11/20/16 1311  BP: 127/68  Pulse: 96  Resp: 20  Weight: 178 lb (80.7 kg)  Height: 5' 7.75" (1.721 m)    Body mass index is 27.26 kg/m.   General: The patient is well-developed and well-nourished and in no acute distress  HEENT:  [place]/AT, pharynx is Mallampati 2.   Nasal passage much wider on left than  right.      Neurologic Exam  Mental status: The patient is alert and oriented x 3 at the time of the examination. The patient has apparent normal recent and remote memory, with an apparently normal attention span and concentration ability.   Speech is normal.  Cranial nerves: Extraocular movements are full.  Facial strength and sensation is normal.  Trapezius and sternocleidomastoid strength is normal. No dysarthria is noted.  The tongue is midline, and the patient has symmetric elevation of the soft palate. Mild reduced left hearing.  Motor:  Muscle bulk is normal.   Tone is normal. Strength is  5 / 5 in all 4 extremities.   Sensory: Sensory testing is intact to soft touch and vibration sensation in all 4 extremities.  Coordination: Cerebellar testing reveals good finger-nose-finger and heel-to-shin bilaterally.  Gait and station: Station is normal.   Gait is normal. Tandem gait is normal. Romberg is negative.   Reflexes: Deep tendon reflexes are symmetric and normal bilaterally.       DIAGNOSTIC DATA (LABS, IMAGING, TESTING) - I reviewed patient records, labs, notes, testing and imaging myself where available.  Lab Results  Component Value Date   WBC 7.6 05/12/2016   HGB 12.1 05/12/2016   HCT 35.9 05/12/2016   MCV 90 05/12/2016   PLT 394 (H) 05/12/2016      Component Value Date/Time   NA 136 11/21/2015 0429   K 4.5 11/21/2015 0429   CL 105 11/21/2015 0429   CO2 26 11/21/2015 0429   GLUCOSE 150 (H) 11/21/2015 0429   BUN 18 11/21/2015 0429   CREATININE 0.89 11/21/2015 0429   CALCIUM 8.9 11/21/2015 0429   GFRNONAA >60 11/21/2015 0429   GFRAA >60 11/21/2015 0429   No results found for: CHOL, HDL, LDLCALC, LDLDIRECT, TRIG, CHOLHDL Lab Results  Component Value Date   HGBA1C 6.0 (H) 11/09/2015  ASSESSMENT AND PLAN  OSA (obstructive sleep apnea)  Restless leg syndrome  Insomnia, unspecified type  Sinusitis, unspecified chronicity, unspecified  location   1.    Continue combination of Ambien 5 mg and hydrocodone 5 mg nightly. 2.    She has severe OSA and she needs to get back on CPAP.   She can resume CPAP.   If no infection and she continues to have difficulties with congestion consider nasal steroids. 3.    Sinus CT to evaluate recurrent sinusitis symptoms refractory to 2 rounds of antibiotics. Based on results may need to be referred to ENT.   3.    She will return to see me in 4 months or sooner if there are new or worsening neurologic symptoms.  Richard A. Felecia Shelling, MD, PhD 1/61/0960, 4:54 PM Certified in Neurology, Clinical Neurophysiology, Sleep Medicine, Pain Medicine and Neuroimaging  [facility] [address], [place], [place] [address] [phone]

## 2016-11-23 NOTE — Telephone Encounter (Signed)
Thank you :)

## 2016-11-24 NOTE — Telephone Encounter (Signed)
Noted, thank you

## 2016-12-01 ENCOUNTER — Telehealth: Payer: Self-pay | Admitting: Neurology

## 2016-12-01 DIAGNOSIS — J329 Chronic sinusitis, unspecified: Secondary | ICD-10-CM

## 2016-12-01 NOTE — Addendum Note (Signed)
Addended by: France Ravens I on: 12/01/2016 01:41 PM   Modules accepted: Orders

## 2016-12-01 NOTE — Telephone Encounter (Signed)
Maria Brennan with [facility] called office in reference to CT Maxillofacial limited w/out contrast.  Order needs to be updated to: CT Maxillofacial w/out contrast, they no longer do the limited.  If any questions contact Maria Brennan at [phone]

## 2016-12-01 NOTE — Telephone Encounter (Signed)
New order for CT Maxillofacial without contrast in EPIC/fim

## 2016-12-09 ENCOUNTER — Other Ambulatory Visit: Payer: Medicare Other

## 2016-12-09 ENCOUNTER — Ambulatory Visit
Admission: RE | Admit: 2016-12-09 | Discharge: 2016-12-09 | Disposition: A | Discharge status: Home / Self Care | Payer: Medicare Other | Source: Ambulatory Visit | Attending: Neurology | Admitting: Neurology

## 2016-12-09 DIAGNOSIS — J329 Chronic sinusitis, unspecified: Secondary | ICD-10-CM

## 2016-12-10 ENCOUNTER — Telehealth: Payer: Self-pay | Admitting: *Deleted

## 2016-12-10 DIAGNOSIS — J322 Chronic ethmoidal sinusitis: Secondary | ICD-10-CM

## 2016-12-10 DIAGNOSIS — J321 Chronic frontal sinusitis: Secondary | ICD-10-CM

## 2016-12-10 DIAGNOSIS — Z1624 Resistance to multiple antibiotics: Secondary | ICD-10-CM

## 2016-12-10 NOTE — Telephone Encounter (Signed)
-----   Message from Britt Bottom, MD sent at 12/09/2016  2:18 PM EDT ----- Please let her know that she appears to have severe right maxillary, ethmoid and frontal sinusitis. I would like her to follow-up with ENT for further evaluation since she has not responded to antibiotics.

## 2016-12-10 NOTE — Telephone Encounter (Signed)
Spoke with pt. and gave below results. She verbalized understanding of same, is agreeable with referral to ENT. Referral ordered in EPIC/fim

## 2017-03-23 ENCOUNTER — Telehealth: Payer: Self-pay | Admitting: Neurology

## 2017-03-23 MED ORDER — HYDROCODONE-ACETAMINOPHEN 5-325 MG PO TABS: 1.0000 | ORAL_TABLET | Freq: Every day | ORAL | 0 refills | Status: DC

## 2017-03-23 NOTE — Addendum Note (Signed)
Addended by: France Ravens I on: 03/23/2017 02:11 PM   Modules accepted: Orders

## 2017-03-23 NOTE — Telephone Encounter (Signed)
Hydrocodone rx. up front [facility]/fim 

## 2017-03-23 NOTE — Telephone Encounter (Signed)
Pt calling for a refill prescription of HYDROcodone-acetaminophen (NORCO/VICODIN) 5-325 MG tablet  Pt new Insurance info: (Primary)Medicare Part A & B  Clarisa Fling) Center Line Member HQ:[id number] Group#pt states not on card RX Bin# pt states not on card

## 2017-03-23 NOTE — Telephone Encounter (Signed)
Rx. awaiting RAS sig/fim 

## 2017-04-16 ENCOUNTER — Telehealth: Payer: Self-pay | Admitting: Neurology

## 2017-04-16 NOTE — Telephone Encounter (Signed)
Spoke with pt. and advised [facility] will be able to eval upper back pain at f/u appt. on 2/13.  Appt. notes edited to reflect pt. has a new complaint/fim

## 2017-04-16 NOTE — Telephone Encounter (Signed)
Pt called she was seen at PCP office yesterday by NP for new onset of pain between the shoulder blade down the right side to the waist for the past 6 mths. NP recommended she should see Dr Felecia Shelling for this. An appt is already scheduled for 2/13- 58mth f/u. Can this be addressed at that time? Please call to advise

## 2017-04-22 ENCOUNTER — Encounter: Payer: Self-pay | Admitting: Neurology

## 2017-04-22 ENCOUNTER — Ambulatory Visit (INDEPENDENT_AMBULATORY_CARE_PROVIDER_SITE_OTHER): Payer: Medicare Other | Admitting: Neurology

## 2017-04-22 ENCOUNTER — Other Ambulatory Visit: Payer: Self-pay

## 2017-04-22 VITALS — BP 120/66 | HR 96 | Resp 20 | Wt 193.0 lb

## 2017-04-22 DIAGNOSIS — M5124 Other intervertebral disc displacement, thoracic region: Secondary | ICD-10-CM | POA: Diagnosis not present

## 2017-04-22 DIAGNOSIS — J329 Chronic sinusitis, unspecified: Secondary | ICD-10-CM

## 2017-04-22 DIAGNOSIS — G4700 Insomnia, unspecified: Secondary | ICD-10-CM | POA: Diagnosis not present

## 2017-04-22 DIAGNOSIS — G2581 Restless legs syndrome: Secondary | ICD-10-CM

## 2017-04-22 DIAGNOSIS — M549 Dorsalgia, unspecified: Secondary | ICD-10-CM | POA: Diagnosis not present

## 2017-04-22 DIAGNOSIS — G4733 Obstructive sleep apnea (adult) (pediatric): Secondary | ICD-10-CM | POA: Diagnosis not present

## 2017-04-22 MED ORDER — HYDROCODONE-ACETAMINOPHEN 5-325 MG PO TABS: 1.0000 | ORAL_TABLET | Freq: Every day | ORAL | 0 refills | Status: DC

## 2017-04-22 MED ORDER — METHYLPREDNISOLONE 4 MG PO TBPK: ORAL_TABLET | ORAL | 0 refills | Status: DC

## 2017-04-22 NOTE — Progress Notes (Signed)
[facility]  PATIENT: Maria Brennan DOB: 05-Apr-1945  REFERRING DOCTOR OR PCP:  [name] SOURCE: patient, notes form [name], labwork from [facility]  _________________________________   HISTORICAL  CHIEF COMPLAINT:  Chief Complaint  Patient presents with  . Sleep Apnea    Still battling sinus infections. On Doxycycline now and planning sinus surgery,  Sts. is compliant with CPAP.  Today she has new c/o pain upper back, b/t shoulders.  Had MRI T-spine at [facility] yesterday.  She has a disc with her/fim  . Back Pain    HISTORY OF PRESENT ILLNESS:  Maria Brennan is a 72 year old woman with insomnia and OSA and a new complaint of upper back pain.  Update 04/22/2017: She is reporting pain in between the shoulder blades, more to the left.      Moving or turning a certain way worsens the pain.   Pain has been off/on x 1 years but worse the past 2 months.  She has tried a muscle relaxant but it did nit help.  She was unable to tolerate gabapentin.    She has mild kidney issues and is not supposed to take NSAID's.    She has not tried a steroid pack.   She also has mild anemia and is to get a GI endoscopy/colonoscopy.    She is using her CPAP nightly and has good compliance.  She sleeps okay many nights. She still has some restless leg syndrome.   She is to have a deviated septum repair and right sinus operation soon.       Sometimes her fingers turn white and cold and are mildly painful.       I personally reviewed the MRI of the thoracic spine. 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 There is no abnormal signal additionally, there are disc protrusions to the right at T7-T8 into left at T9-T10.   From 11/20/2016: OSA:    On 05/26/2016 PSG she was found to have severe OSA (AHI = 40).   She had her CPAP titration in April 2018 and was titrated to a pressure of +7 cm.   Initially, she did well with OSA and used it  nightly.  She fell asleep more rapidly with it and her snoring resolved. She was slightly less sleepy during the daytime.      However, the last month or two she has not used it much.   When she uses it, efficacy is good with an AHI = 3.6 on the 09/24/16 download (compliance was 97% at that time).   She notes rhinitis on the right.    She has been treated with 2 rounds of antibiotics for sinusitis but still has nasal drainage.     Insomnia:   She felt insomnia did better on CPAP and now she has more insomnia, sleep onset > maintenance.   She is on zolpidem 10 mg and feels it helps.     Taking Ambien 5 mg with hydrocodone and CBD oil helps her fall asleep the best.  She could not tolerate gabapentin.    Hydroxyzine sometimes helps but she can have a hangover if she takes  RLS:   She has RLS when she lays down and if in a car a long time.    He sleep study also showed mild periodic limb movements of sleep. She can't get comfortable and has to move after a couple minutes.  The combination of night time hydrocodone  and Ambien has helped.    Other:   She is on hydrocodone nightly for her back pain and it also helps her RLS.       EPWORTH SLEEPINESS SCALE  On a scale of 0 - 3 what is the chance of dozing:  Sitting and Reading:   3 Watching TV:    3 Sitting inactive in a public place: 1 Passenger in car for one hour: 3 Lying down to rest in the afternoon: 3 Sitting and talking to someone: 0 Sitting quietly after lunch:  3 In a car, stopped in traffic:  1  Total (out of 24):   17/24 (moderate sleepiness -- while not on CPAP)  RESULTS OF PSG  IMPRESSION:  1.  Severe OSA with an AHI = 40.4.   OSA was worse while supine. 2.  Mild periodic limb movements of sleep with little impact on sleep 3.  She had a delayed sleep onset which could be due to a 'first night effect' in the sleep lab.  If persistent at home after correction of the OSA, consider a sleep aid.     RECOMMENDATIONS:  1. Advise  full-night, attended, CPAP titration study to optimize therapy.  If unable to tolerate CPAP, consider positional therapy, an oral appliance or ENT consultation for surgery.   2. Avoid sedative-hypnotics which may worsen sleep apnea, alcohol and tobacco (as applicable). 3. Advise patient to avoid driving or operating hazardous machinery when sleepy. 4. A follow up appointment will be scheduled in the Sleep Clinic at [facility]. The referring provider will be notified of the results.       REVIEW OF SYSTEMS: Constitutional: No fevers, chills, sweats, or change in appetite Eyes: No visual changes, double vision, eye pain Ear, nose and throat: No hearing loss, ear pain, nasal congestion, sore throat Cardiovascular: No chest pain, palpitations Respiratory: No shortness of breath at rest or with exertion.   No wheezes GastrointestinaI: No nausea, vomiting, diarrhea, abdominal pain, fecal incontinence Genitourinary: No dysuria, urinary retention or frequency.  No nocturia. Musculoskeletal: No neck pain, back pain Integumentary: No rash, pruritus, skin lesions Neurological: as above Psychiatric: No depression at this time.  No anxiety Endocrine: No palpitations, diaphoresis, change in appetite, change in weigh or increased thirst Hematologic/Lymphatic: No anemia, purpura, petechiae. Allergic/Immunologic: No itchy/runny eyes, nasal congestion, recent allergic reactions, rashes  ALLERGIES: Allergies  Allergen Reactions  . Rosuvastatin Other (See Comments)  . Codeine Itching and Nausea Only    HOME MEDICATIONS:  Current Outpatient Medications:  .  atorvastatin (LIPITOR) 40 MG tablet, Take 40 mg by mouth every evening. , Disp: , Rfl: 0 .  Biotin 10 MG TABS, Take by mouth., Disp: , Rfl:  .  Cholecalciferol (VITAMIN D3) 2000 units TABS, Take 2,000 Units by mouth daily., Disp: , Rfl:  .  CRANBERRY PO, Take by mouth., Disp: , Rfl:  .  docusate sodium (COLACE) 100 MG  capsule, Take 1 capsule (100 mg total) by mouth 2 (two) times daily., Disp: 10 capsule, Rfl: 0 .  DULoxetine (CYMBALTA) 30 MG capsule, Take 30 mg by mouth 2 (two) times daily., Disp: , Rfl: 0 .  Exenatide ER (BYDUREON) 2 MG PEN, Inject into the skin., Disp: , Rfl:  .  ferrous sulfate 325 (65 FE) MG EC tablet, Take 325 mg by mouth 3 (three) times daily with meals., Disp: , Rfl:  .  GLUCOSAMINE-CHONDROITIN PO, Take 1 tablet by mouth 2 (two) times daily., Disp: , Rfl:  .  HYDROcodone-acetaminophen (NORCO/VICODIN) 5-325 MG tablet, Take 1 tablet by mouth at bedtime., Disp: 90 tablet, Rfl: 0 .  iron polysaccharides (NIFEREX) 150 MG capsule, Take by mouth., Disp: , Rfl:  .  metFORMIN (GLUCOPHAGE) 500 MG tablet, Take 500 mg by mouth daily., Disp: , Rfl: 0 .  omeprazole (PRILOSEC) 40 MG capsule, Take 40 mg by mouth every evening. , Disp: , Rfl: 0 .  triamterene-hydrochlorothiazide (MAXZIDE-25) 37.5-25 MG tablet, Take 0.5 tablets by mouth daily. , Disp: , Rfl:  .  Turmeric 500 MG TABS, Take by mouth., Disp: , Rfl:  .  UNABLE TO FIND, Collagen Bone Powder, Disp: , Rfl:  .  zolpidem (AMBIEN) 5 MG tablet, Take 1 tablet (5 mg total) by mouth at bedtime., Disp: 30 tablet, Rfl: 5 .  Cyanocobalamin (VITAMIN B-12 PO), Take 1 tablet by mouth daily., Disp: , Rfl:  .  magnesium oxide (MAG-OX) 400 MG tablet, Take 400 mg by mouth daily., Disp: , Rfl:  .  methylPREDNISolone (MEDROL DOSEPAK) 4 MG TBPK tablet, Take over 6 days starting with 6 pills po the first day., Disp: 21 tablet, Rfl: 0 .  polyethylene glycol (MIRALAX / GLYCOLAX) packet, Take 17 g by mouth 2 (two) times daily., Disp: 14 each, Rfl: 0 .  Potassium 99 MG TABS, Take 99 mg by mouth daily., Disp: , Rfl:   PAST MEDICAL HISTORY: Past Medical History:  Diagnosis Date  . Arthritis    oa  . Diabetes mellitus without complication ([place])   . Family history of adverse reaction to anesthesia    mother and sister slow to awaken   . GERD (gastroesophageal reflux  disease)   . History of hiatal hernia   . Hypertension   . MVP (mitral valve prolapse)   . PONV (postoperative nausea and vomiting) yrs ago    PAST SURGICAL HISTORY: Past Surgical History:  Procedure Laterality Date  . ABDOMINAL HYSTERECTOMY     partial  . BREAST SURGERY Bilateral    implants  . rotator  cuff Right   . 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 Heart disease Mother   . Diabetes Mother   . Diabetes Father   . Non-Hodgkin's lymphoma Father     SOCIAL HISTORY:  Social History   Socioeconomic History  . Marital status: Married    Spouse name: Not on file  . Number of children: Not on file  . Years of education: Not on file  . Highest education level: Not on file  Social Needs  . Financial resource strain: Not on file  . Food insecurity - worry: Not on file  . Food insecurity - inability: Not on file  . Transportation needs - medical: Not on file  . Transportation needs - non-medical: Not on file  Occupational History  . Not on file  Tobacco Use  . Smoking status: Current Every Day Smoker    Packs/day: 0.50    Years: 53.00    Pack years: 26.50    Types: Cigarettes  . Smokeless tobacco: Never Used  Substance and Sexual Activity  . Alcohol use: No  . Drug use: No  . Sexual activity: Not on file  Other Topics Concern  . Not on file  Social History Narrative  . Not on file     PHYSICAL EXAM  Vitals:   04/22/17 1305  BP: 120/66  Pulse: 96  Resp: 20  Weight: 193 lb (87.5 kg)    Body mass index is 29.56 kg/m.   General: The patient is well-developed and well-nourished and in no acute distress  HEENT:  [place]/AT, pharynx is Mallampati 2.   Nasal passage much wider on left than right.      Neurologic Exam  Mental status: The patient is  alert and oriented x 3 at the time of the examination. The patient has apparent normal recent and remote memory, with an apparently normal attention span and concentration ability.   Speech is normal.  Cranial nerves: Extraocular movements are full.  Facial strength and sensation is normal.  Trapezius and sternocleidomastoid strength is normal. No dysarthria is noted.  The tongue is midline, and the patient has symmetric elevation of the soft palate. Mild reduced left hearing.  Motor:  Muscle bulk is normal.   Tone is normal. Strength is  5 / 5 in all 4 extremities.   Sensory: Sensory testing is intact to soft touch and vibration sensation in all 4 extremities.  Coordination: Cerebellar testing reveals good finger-nose-finger and heel-to-shin bilaterally.  Gait and station: Station is normal.   Gait is normal. Tandem gait is normal. Romberg is negative.   Reflexes: Deep tendon reflexes are symmetric and normal bilaterally.       DIAGNOSTIC DATA (LABS, IMAGING, TESTING) - I reviewed patient records, labs, notes, testing and imaging myself where available.  Lab Results  Component Value Date   WBC 7.6 05/12/2016   HGB 12.1 05/12/2016   HCT 35.9 05/12/2016   MCV 90 05/12/2016   PLT 394 (H) 05/12/2016      Component Value Date/Time   NA 136 11/21/2015 0429   K 4.5 11/21/2015 0429   CL 105 11/21/2015 0429   CO2 26 11/21/2015 0429   GLUCOSE 150 (H) 11/21/2015 0429   BUN 18 11/21/2015 0429   CREATININE 0.89 11/21/2015 0429   CALCIUM 8.9 11/21/2015 0429   GFRNONAA >60 11/21/2015 0429   GFRAA >60 11/21/2015 0429   No results found for: CHOL, HDL, LDLCALC, LDLDIRECT, TRIG, CHOLHDL Lab Results  Component Value Date   HGBA1C 6.0 (H) 11/09/2015       ASSESSMENT AND PLAN  No diagnosis found.   1.    Medol dose pack.    If not better, consider C7-T1 ESI.  Continue hydrocodone 5 mg nightly.    Refilled for 90 days.     2.    Continue CPAP.  3.    She will follow-up with ENT and  GI as scheduled 4.    She will return to see me prn if there are new or worsening neurologic symptoms.  Xachary Hambly A. Felecia Shelling, MD, PhD 2/95/2841, 3:24 PM Certified in Neurology, Clinical Neurophysiology, Sleep Medicine, Pain Medicine and Neuroimaging  [facility] [address] [place], [place] [address] [phone]

## 2017-04-22 NOTE — Progress Notes (Signed)
osa

## 2017-04-28 ENCOUNTER — Encounter: Payer: Self-pay | Admitting: Pediatrics

## 2017-04-28 ENCOUNTER — Ambulatory Visit (INDEPENDENT_AMBULATORY_CARE_PROVIDER_SITE_OTHER): Admitting: Pediatrics

## 2017-04-28 VITALS — BP 104/62 | HR 87 | Temp 97.9°F | Resp 16 | Ht 67.0 in | Wt 193.0 lb

## 2017-04-28 DIAGNOSIS — Z720 Tobacco use: Secondary | ICD-10-CM | POA: Diagnosis not present

## 2017-04-28 DIAGNOSIS — K219 Gastro-esophageal reflux disease without esophagitis: Secondary | ICD-10-CM | POA: Insufficient documentation

## 2017-04-28 DIAGNOSIS — Z9989 Dependence on other enabling machines and devices: Secondary | ICD-10-CM

## 2017-04-28 DIAGNOSIS — G4733 Obstructive sleep apnea (adult) (pediatric): Secondary | ICD-10-CM

## 2017-04-28 DIAGNOSIS — I10 Essential (primary) hypertension: Secondary | ICD-10-CM | POA: Diagnosis not present

## 2017-04-28 DIAGNOSIS — J3089 Other allergic rhinitis: Secondary | ICD-10-CM

## 2017-04-28 DIAGNOSIS — D849 Immunodeficiency, unspecified: Secondary | ICD-10-CM | POA: Diagnosis not present

## 2017-04-28 DIAGNOSIS — J328 Other chronic sinusitis: Secondary | ICD-10-CM | POA: Diagnosis not present

## 2017-04-28 DIAGNOSIS — Z87891 Personal history of nicotine dependence: Secondary | ICD-10-CM | POA: Insufficient documentation

## 2017-04-28 NOTE — Patient Instructions (Addendum)
Environmental control of dust mite Claritin 10 mg once a day for runny nose or itching Nasacort AQ 1 spray per nostril twice a day for stuffy nose  Let me see you  after you recover from your  sinus surgery Call me if you're not doing well on this treatment plan Stop smoking cigarettes To evaluate your recurrent pneumonias and  sinus infections, I will do some lab work. I will call you with the results.

## 2017-04-28 NOTE — Progress Notes (Signed)
[place] [id number] Dept: [phone]  New Patient Note  Patient ID: Maria Brennan, female    DOB: 1945-08-26  Age: 72 y.o. MRN: [id number] Date of Office Visit: 04/28/2017 Referring provider: Kristopher Glee., MD [address] [place], [place] [address]    Chief Complaint: Sinus Problem and Allergies  HPI Chosen Garron presents for evaluation of sinus problems  since her teen years. She has had a postnasal drainage. She has been diagnosed as having chronic sinusitis and iis scheduled to have sinus surgery after she is evaluated for iron deficiency anemia which did not respond with the use of iron. She has had an abnormal sinus CT She has  obstructive sleep apnea and uses CPAP. She has a history of several pneumonias in the past. She has aggravation of her nasal congestion on exposure to dust, cigarette smoke and temperature changes. She finished a course of antibiotics for chronic sinusitis yesterday. She does not have problems with coughing but she has been a smoker for 55 years averaging less than one pack per day. She has not had eczema or chronic hives. She has not had asthmatic symptoms. She became very emotionally fragile when she took a  methylprednisolone Dose pak. She is up-to-date with her immunizations and Pneumovax  Review of Systems  Constitutional: Negative.   HENT:       Obstructive sleep apnea needing CPAP. Chronic sinusitis  Eyes: Negative.   Respiratory:       Several episodes of pneumonia  Cardiovascular:       Hypertension. One hospitalization for chest pain evaluation which was felt to be related to gastroesophageal reflux  Gastrointestinal:       Gastroesophageal reflux  Genitourinary:       Hysterectomy. History of several urinary tract infections  Musculoskeletal:       Osteoarthritis. Bulging disks in her upper back. Right knee replacement. Torn right rotator cuff  Skin:       Chronic urticaria in the summer months when she is   outside  Neurological:       Bulging disks in her upper back  Endo/Heme/Allergies:       Diabetes for 20 years. No thyroid disease. Anemia noted 6 months ago with no improvement with iron. She I s scheduled for an upper and lower endoscopy soon  Psychiatric/Behavioral:       Emotionally fragile with methylprednisolone. History of depression    Outpatient Encounter Medications as of 04/28/2017  Medication Sig  . atorvastatin (LIPITOR) 40 MG tablet Take 40 mg by mouth every evening.   . Biotin 10 MG TABS Take by mouth.  . Cholecalciferol (VITAMIN D3) 2000 units TABS Take 2,000 Units by mouth daily.  Marland Kitchen CRANBERRY PO Take by mouth.  . Cyanocobalamin (VITAMIN B-12 PO) Take 1 tablet by mouth daily.  Marland Kitchen docusate sodium (COLACE) 100 MG capsule Take 1 capsule (100 mg total) by mouth 2 (two) times daily.  . DULoxetine (CYMBALTA) 30 MG capsule Take 30 mg by mouth 2 (two) times daily.  . Exenatide ER (BYDUREON) 2 MG PEN Inject into the skin.  . ferrous sulfate 325 (65 FE) MG EC tablet Take 325 mg by mouth 3 (three) times daily with meals.  Marland Kitchen GLUCOSAMINE-CHONDROITIN PO Take 1 tablet by mouth 2 (two) times daily.  Marland Kitchen HYDROcodone-acetaminophen (NORCO/VICODIN) 5-325 MG tablet Take 1 tablet by mouth at bedtime.  . iron polysaccharides (NIFEREX) 150 MG capsule Take by mouth.  . magnesium oxide (MAG-OX) 400 MG  tablet Take 400 mg by mouth daily.  . metFORMIN (GLUCOPHAGE) 500 MG tablet Take 500 mg by mouth daily.  . methylPREDNISolone (MEDROL DOSEPAK) 4 MG TBPK tablet Take over 6 days starting with 6 pills po the first day.  Marland Kitchen omeprazole (PRILOSEC) 40 MG capsule Take 40 mg by mouth every evening.   . polyethylene glycol (MIRALAX / GLYCOLAX) packet Take 17 g by mouth 2 (two) times daily.  . Potassium 99 MG TABS Take 99 mg by mouth daily.  Marland Kitchen triamterene-hydrochlorothiazide (MAXZIDE-25) 37.5-25 MG tablet Take 0.5 tablets by mouth daily.   . Turmeric 500 MG TABS Take by mouth.  Marland Kitchen UNABLE TO FIND Collagen Bone  Powder  . zolpidem (AMBIEN) 5 MG tablet Take 1 tablet (5 mg total) by mouth at bedtime.   No facility-administered encounter medications on file as of 04/28/2017.      Drug Allergies:  Allergies  Allergen Reactions  . Rosuvastatin Other (See Comments)  . Codeine Itching and Nausea Only    Family History: Skylin's family history includes Allergic rhinitis in her daughter; Angioedema in her brother; Asthma in her brother; Diabetes in her father and mother; Eczema in her daughter, grandchild, and maternal grandmother; Heart disease in her mother; Lupus in her daughter; Migraines in her grandchild and mother; Non-Hodgkin's lymphoma in her father; Urticaria in her brother, father, and sister.. There is no family history of chronic bronchitis or emphysema  Social and environmental. There is a dog in the home. She has been smoking cigarettes for 55 years averaging less than half a pack a day. She is a homemaker  Physical Exam: BP 104/62   Pulse 87   Temp 97.9 F (36.6 C) (Oral)   Resp 16   Ht 5\' 7"  (1.702 m)   Wt 193 lb (87.5 kg)   SpO2 95%   BMI 30.23 kg/m    Physical Exam  Constitutional: She is oriented to person, place, and time. She appears well-developed and well-nourished.  HENT:  Eyes normal. Ears normal. Nose mild swelling of nasal turbinates with suggestive  early polypoid changes in the right nostril. Pharynx normal except for prominent posterior pharyngeal tissue  Neck: Neck supple. No thyromegaly present.  Cardiovascular:  S1 and S2 normal no murmurs  Pulmonary/Chest:  Clear to percussion and auscultation  Abdominal: Soft. There is no tenderness (no hepatosplenomegaly).  Lymphadenopathy:    She has no cervical adenopathy.  Neurological: She is alert and oriented to person, place, and time.  Skin:  Clear  Psychiatric: She has a normal mood and affect. Her behavior is normal. Judgment and thought content normal.  Vitals reviewed.   Diagnostics:  Allergy skin  tests were positive to dust mite and cockroach on intradermal testing only  Assessment  Assessment and Plan: 1. Other allergic rhinitis   2. Obstructive sleep apnea treated with continuous positive airway pressure (CPAP)   3. Other chronic sinusitis   4. Tobacco use   5. Essential hypertension   6. Gastroesophageal reflux disease without esophagitis   7. Immunodeficiency, unspecified ([place])        Patient Instructions  Environmental control of dust mite Claritin 10 mg once a day for runny nose or itching Nasacort AQ 1 spray per nostril twice a day for stuffy nose  Let me see you  after you recover from your  sinus surgery Call me if you're not doing well on this treatment plan Stop smoking cigarettes To evaluate your recurrent pneumonias and  sinus infections, I will do some  lab work. I will call you with the results.   Return if symptoms worsen or fail to improve.   Thank you for the opportunity to care for this patient.  Please do not hesitate to contact me with questions.  Penne Lash, M.D.  [facility] [address] [place], [place] [address] [phone]

## 2017-04-29 DIAGNOSIS — D849 Immunodeficiency, unspecified: Secondary | ICD-10-CM | POA: Insufficient documentation

## 2017-05-08 ENCOUNTER — Other Ambulatory Visit: Payer: Self-pay | Admitting: Otolaryngology

## 2017-05-14 LAB — STREP PNEUMONIAE 14 SEROTYPES IGG
PNEUMO AB TYPE 12 (12F): 2.1 ug/mL (ref >1.3)
PNEUMO AB TYPE 1: 1.5 ug/mL (ref >1.3)
PNEUMO AB TYPE 8: 0.2 ug/mL — AB (ref >1.3)
PNEUMO AB TYPE 9 (9N): 0.9 ug/mL — AB (ref >1.3)
Pneumo Ab Type 14*: 1.8 ug/mL (ref >1.3)
Pneumo Ab Type 19 (19F)*: 0.5 ug/mL — ABNORMAL LOW (ref >1.3)
Pneumo Ab Type 23 (23F)*: 0.6 ug/mL — ABNORMAL LOW (ref >1.3)
Pneumo Ab Type 26 (6B)*: 0.7 ug/mL — ABNORMAL LOW (ref >1.3)
Pneumo Ab Type 3*: 0.5 ug/mL — ABNORMAL LOW (ref >1.3)
Pneumo Ab Type 51 (7F)*: <0.1 ug/mL — ABNORMAL LOW (ref >1.3)
Pneumo Ab Type 56 (18C)*: 1.5 ug/mL (ref >1.3)
Pneumo Ab Type 57 (19A)*: 0.9 ug/mL — ABNORMAL LOW (ref >1.3)
Pneumo Ab Type 68 (9V)*: 0.5 ug/mL — ABNORMAL LOW (ref >1.3)

## 2017-05-14 LAB — CBC WITH DIFFERENTIAL/PLATELET
BASOS ABS: 0 10*3/uL (ref 0.0–0.2)
BASOS: 0 %
EOS (ABSOLUTE): 0.2 10*3/uL (ref 0.0–0.4)
Eos: 2 %
HEMOGLOBIN: 12.6 g/dL (ref 11.1–15.9)
Hematocrit: 37.7 % (ref 34.0–46.6)
IMMATURE GRANS (ABS): 0 10*3/uL (ref 0.0–0.1)
Immature Granulocytes: 0 %
LYMPHS ABS: 1.7 10*3/uL (ref 0.7–3.1)
LYMPHS: 25 %
MCH: 30.7 pg (ref 26.6–33.0)
MCHC: 33.4 g/dL (ref 31.5–35.7)
MCV: 92 fL (ref 79–97)
Monocytes Absolute: 0.4 10*3/uL (ref 0.1–0.9)
Monocytes: 6 %
NEUTROS ABS: 4.6 10*3/uL (ref 1.4–7.0)
Neutrophils: 67 %
Platelets: 317 10*3/uL (ref 150–379)
RBC: 4.11 x10E6/uL (ref 3.77–5.28)
RDW: 13 % (ref 12.3–15.4)
WBC: 7 10*3/uL (ref 3.4–10.8)

## 2017-05-14 LAB — IGG 1, 2, 3, AND 4
IGG 4: 24 mg/dL (ref 2–96)
IGG, SUBCLASS 1: 261 mg/dL (ref 248–810)
IGG, SUBCLASS 2: 206 mg/dL (ref 130–555)
IGG, SUBCLASS 3: 76 mg/dL (ref 15–102)
IgG (Immunoglobin G), Serum: 624 mg/dL — ABNORMAL LOW (ref 700–1600)

## 2017-05-14 LAB — IGG, IGA, IGM
IgA/Immunoglobulin A, Serum: 455 mg/dL — ABNORMAL HIGH (ref 87–352)
IgM (Immunoglobulin M), Srm: 60 mg/dL (ref 26–217)

## 2017-05-14 LAB — IGE: IGE (IMMUNOGLOBULIN E), SERUM: 120 [IU]/mL — AB (ref 0–100)

## 2017-05-28 ENCOUNTER — Encounter: Payer: Self-pay | Admitting: Pediatrics

## 2017-05-28 ENCOUNTER — Telehealth: Payer: Self-pay

## 2017-05-28 DIAGNOSIS — D849 Immunodeficiency, unspecified: Secondary | ICD-10-CM

## 2017-05-28 NOTE — Addendum Note (Signed)
Addended byOralia Rud M on: 05/28/2017 05:03 PM   Modules accepted: Orders

## 2017-05-28 NOTE — Telephone Encounter (Signed)
Per Dr. Shaune Leeks, call patient.  Let her know she needs a Pneumovax 23 from her primary care doctor.  Then get post vaccine titers 6 weeks after Pneumovax to see if she has protective levels. Spoke with patient.  Will mail patient order to get Pneumovax at [name] and a lab order for post titer vaccines 6 weeks later. Patient verbalized understanding.

## 2017-06-01 ENCOUNTER — Telehealth: Payer: Self-pay | Admitting: Allergy

## 2017-06-01 NOTE — Telephone Encounter (Signed)
Left message for patient to call office.  

## 2017-06-02 ENCOUNTER — Telehealth: Payer: Self-pay | Admitting: *Deleted

## 2017-06-02 ENCOUNTER — Encounter: Payer: Self-pay | Admitting: Pediatrics

## 2017-06-02 MED ORDER — HYDROCODONE-ACETAMINOPHEN 5-325 MG PO TABS: 1.0000 | ORAL_TABLET | Freq: Every day | ORAL | 0 refills | Status: DC

## 2017-06-02 NOTE — Telephone Encounter (Signed)
Patient stopped by the office to leave a letter for Dr. Felecia Shelling to see.  Also wants a RX for Norco 5-325.  Wants to know if it can be increased to 2 per day?  Wants to wait for this.  She is in the lobby.

## 2017-06-02 NOTE — Telephone Encounter (Signed)
Spoke with Malachy Mood and explained that Norco can't be filled for 90 day supplies since it is a controlled substance, and per RAS, he can't increase to bid due to her OSA.  She verbalized understanding of same, sts. is considering cervical esi, as mentioned in last ov note, but is holding off for now due to fear of high cbg's (she is diabetic.)  She has an appt. with her pcp on 4/8; will let us know if she decides she would like esi ordered/fim

## 2017-06-03 NOTE — Telephone Encounter (Signed)
Rx. up front [facility]/fim 

## 2017-06-08 ENCOUNTER — Telehealth: Payer: Self-pay | Admitting: Neurology

## 2017-06-08 DIAGNOSIS — R937 Abnormal findings on diagnostic imaging of other parts of musculoskeletal system: Secondary | ICD-10-CM

## 2017-06-08 DIAGNOSIS — M542 Cervicalgia: Secondary | ICD-10-CM

## 2017-06-08 DIAGNOSIS — M549 Dorsalgia, unspecified: Secondary | ICD-10-CM

## 2017-06-08 NOTE — Telephone Encounter (Signed)
Cervical esi order placed in Epic.  Spoke with Maria Brennan and explained she will hear from [facility] to schedule/fim

## 2017-06-08 NOTE — Telephone Encounter (Signed)
Pt has called to inform RN Faith that due to pain in back she would like to move forward with the injections in her back that was previously discussed.  Pt is asking to be called to schedule

## 2017-06-08 NOTE — Addendum Note (Signed)
Addended by: France Ravens I on: 06/08/2017 05:01 PM   Modules accepted: Orders

## 2017-06-15 ENCOUNTER — Telehealth: Payer: Self-pay | Admitting: Neurology

## 2017-06-15 NOTE — Telephone Encounter (Signed)
Spoke with Malachy Mood and explained radiologist is not comfortable doing cervical esi until cervical mri is done.  She is agreeable.  Per v/o RAS, MRI c-spine wthout contrast ordered/fim

## 2017-06-15 NOTE — Telephone Encounter (Signed)
Noted  

## 2017-06-15 NOTE — Addendum Note (Signed)
Addended by: France Ravens I on: 06/15/2017 01:21 PM   Modules accepted: Orders

## 2017-06-15 NOTE — Telephone Encounter (Signed)
Pt called to advise GI could not see enough of the cervical area and upper back to have the injections. Please call to advise

## 2017-06-15 NOTE — Telephone Encounter (Signed)
Medicare/[place] order sent to GI they will reach out to the pt to schedule.

## 2017-06-25 ENCOUNTER — Inpatient Hospital Stay: Admission: RE | Admit: 2017-06-25 | Payer: Medicare Other | Source: Ambulatory Visit

## 2017-06-29 ENCOUNTER — Other Ambulatory Visit: Payer: Self-pay

## 2017-07-09 ENCOUNTER — Ambulatory Visit
Admission: RE | Admit: 2017-07-09 | Discharge: 2017-07-09 | Disposition: A | Discharge status: Home / Self Care | Payer: Medicare Other | Source: Ambulatory Visit | Attending: Neurology | Admitting: Neurology

## 2017-07-09 DIAGNOSIS — M542 Cervicalgia: Secondary | ICD-10-CM | POA: Diagnosis not present

## 2017-07-13 ENCOUNTER — Telehealth: Payer: Self-pay | Admitting: Neurology

## 2017-07-13 ENCOUNTER — Telehealth: Payer: Self-pay | Admitting: *Deleted

## 2017-07-13 DIAGNOSIS — M549 Dorsalgia, unspecified: Secondary | ICD-10-CM

## 2017-07-13 NOTE — Telephone Encounter (Signed)
LMOM last rx. written 06/02/17 was for #90. Directions are for one qhs, so she should have med thru the end of [place].  Please call if she has other questions/fim

## 2017-07-13 NOTE — Telephone Encounter (Signed)
Pt requesting a refill for HYDROcodone-acetaminophen (NORCO/VICODIN) 5-325 MG tablet

## 2017-07-13 NOTE — Telephone Encounter (Signed)
-----   Message from Britt Bottom, MD sent at 07/09/2017  6:13 PM EDT ----- Please let her know that the MRI of the cervical spine shows significant degenerative changes at several levels.  At C7-T1, she has a small herniated disc.  Please set up a cervical epidural steroid injection at C7-T1.

## 2017-07-14 ENCOUNTER — Telehealth: Payer: Self-pay | Admitting: Neurology

## 2017-07-14 MED ORDER — HYDROCODONE-ACETAMINOPHEN 5-325 MG PO TABS: 1.0000 | ORAL_TABLET | Freq: Every day | ORAL | 0 refills | Status: DC

## 2017-07-14 NOTE — Telephone Encounter (Signed)
Confirmed with pharmacy that only #30 were filled from last rx.  Rx. up front [facility].  Pt. aware/fim

## 2017-07-14 NOTE — Addendum Note (Signed)
Addended by: France Ravens I on: 07/14/2017 02:36 PM   Modules accepted: Orders

## 2017-07-14 NOTE — Telephone Encounter (Signed)
Pt called said RX could only be written for #30/mth and it was filled for #30. She has talked [facility] said the other 63 has been written off. Please send new RX for #30 to [facility].  Please call to advise

## 2017-07-21 NOTE — Telephone Encounter (Signed)
error 

## 2017-07-24 ENCOUNTER — Telehealth: Payer: Self-pay | Admitting: Neurology

## 2017-07-24 NOTE — Telephone Encounter (Signed)
Spoke to patient - she is aware of results and verbalized understanding.  Right now, her upper back is much better and she would like to hold off on ESI treatment.

## 2017-07-24 NOTE — Telephone Encounter (Signed)
Pt said it has been 2 weeks since imaging, she is requesting results. She is aware the clinic closes at noon today.

## 2017-07-24 NOTE — Telephone Encounter (Signed)
Please let her know that the MRI of the cervical spine showed degenerative changes in the mid to lower spine.  The 2 worst levels are at C5-C6 where disc bulging and a bone spur puts a little bit of pressure on the right C6 nerve root.  Additionally she has a small herniated disc at C7-T1.   If her pain is not any better please set up an epidural steroid injection at C7-T1      IMPRESSION: This MRI of the cervical spine without contrast shows multilevel degenerative changes as detailed above.  The most significant findings are: 1.   At C4-C5, there is mild central disc protrusion and uncovertebral spurring but no spinal stenosis or nerve root compression. 2.   At C5-C6, there is borderline spinal stenosis due to uncovertebral spurring and disc bulging.  There is moderately severe right foraminal narrowing with potential for right C6 nerve root compression. 3.   At C6-C7, there is borderline spinal stenosis due to a combination of disc protrusion and uncovertebral spurring.  There is no nerve root compression. 4.   At C7-T1, there is a central disc herniation indenting the thecal sac without causing spinal cord compression.  There is no nerve root compression.

## 2017-08-19 ENCOUNTER — Telehealth: Payer: Self-pay | Admitting: Neurology

## 2017-08-19 MED ORDER — HYDROCODONE-ACETAMINOPHEN 5-325 MG PO TABS: 1.0000 | ORAL_TABLET | Freq: Every day | ORAL | 0 refills | Status: DC

## 2017-08-19 NOTE — Telephone Encounter (Signed)
Rx. up front [facility]/fim 

## 2017-08-19 NOTE — Telephone Encounter (Signed)
Pt has called for her refill on her HYDROcodone-acetaminophen (NORCO/VICODIN) 5-325 MG tablet

## 2017-08-19 NOTE — Telephone Encounter (Signed)
Rx. awaiting RAS sig/fim 

## 2017-08-19 NOTE — Addendum Note (Signed)
Addended by: France Ravens I on: 08/19/2017 02:28 PM   Modules accepted: Orders

## 2017-08-20 ENCOUNTER — Ambulatory Visit
Admission: RE | Admit: 2017-08-20 | Discharge: 2017-08-20 | Disposition: A | Discharge status: Home / Self Care | Payer: Medicare Other | Source: Ambulatory Visit | Attending: Neurology | Admitting: Neurology

## 2017-08-20 DIAGNOSIS — M549 Dorsalgia, unspecified: Secondary | ICD-10-CM

## 2017-08-20 MED ORDER — IOPAMIDOL (ISOVUE-M 300) INJECTION 61%
1.0000 mL | Freq: Once | INTRAMUSCULAR | Status: AC | PRN
  Administered 2017-08-20: 1 mL via EPIDURAL

## 2017-08-20 MED ORDER — TRIAMCINOLONE ACETONIDE 40 MG/ML IJ SUSP (RADIOLOGY)
60.0000 mg | Freq: Once | INTRAMUSCULAR | Status: AC
  Administered 2017-08-20: 60 mg via EPIDURAL

## 2017-08-20 NOTE — Discharge Instructions (Signed)

## 2017-09-15 ENCOUNTER — Telehealth: Payer: Self-pay | Admitting: Neurology

## 2017-09-15 MED ORDER — HYDROCODONE-ACETAMINOPHEN 5-325 MG PO TABS: 1.0000 | ORAL_TABLET | Freq: Every day | ORAL | 0 refills | Status: DC

## 2017-09-15 NOTE — Telephone Encounter (Signed)
Rx. awaiting RAS sig/fim 

## 2017-09-15 NOTE — Telephone Encounter (Signed)
Patient requesting refill of HYDROcodone-acetaminophen (NORCO/VICODIN) 5-325 MG tablet. ° ° °

## 2017-09-15 NOTE — Telephone Encounter (Signed)
Rx. up front [facility]/fim 

## 2017-09-15 NOTE — Addendum Note (Signed)
Addended by: France Ravens I on: 09/15/2017 03:02 PM   Modules accepted: Orders

## 2017-10-15 ENCOUNTER — Ambulatory Visit (INDEPENDENT_AMBULATORY_CARE_PROVIDER_SITE_OTHER): Payer: Medicare Other | Admitting: Neurology

## 2017-10-15 ENCOUNTER — Other Ambulatory Visit: Payer: Self-pay

## 2017-10-15 ENCOUNTER — Encounter: Payer: Self-pay | Admitting: Neurology

## 2017-10-15 VITALS — BP 106/56 | HR 72 | Resp 18 | Ht 67.0 in | Wt 186.5 lb

## 2017-10-15 DIAGNOSIS — M5412 Radiculopathy, cervical region: Secondary | ICD-10-CM | POA: Insufficient documentation

## 2017-10-15 DIAGNOSIS — M549 Dorsalgia, unspecified: Secondary | ICD-10-CM | POA: Diagnosis not present

## 2017-10-15 DIAGNOSIS — M5124 Other intervertebral disc displacement, thoracic region: Secondary | ICD-10-CM | POA: Diagnosis not present

## 2017-10-15 DIAGNOSIS — Z9989 Dependence on other enabling machines and devices: Secondary | ICD-10-CM

## 2017-10-15 DIAGNOSIS — G4733 Obstructive sleep apnea (adult) (pediatric): Secondary | ICD-10-CM | POA: Diagnosis not present

## 2017-10-15 DIAGNOSIS — M25511 Pain in right shoulder: Secondary | ICD-10-CM | POA: Insufficient documentation

## 2017-10-15 MED ORDER — HYDROCODONE-ACETAMINOPHEN 5-325 MG PO TABS: 1.0000 | ORAL_TABLET | Freq: Every day | ORAL | 0 refills | Status: DC

## 2017-10-15 NOTE — Progress Notes (Signed)
[facility]  PATIENT: Maria Brennan DOB: [birth year]  REFERRING DOCTOR OR PCP:  [name] SOURCE: patient, notes form [name], labwork from [facility]  _________________________________   HISTORICAL  CHIEF COMPLAINT:  Chief Complaint  Patient presents with  . Sleep Apnea    Reports compliance with CPAP.  Had sinus surgery and is doing better. Sts. back pain is no better.  Sts. cervical esi done at [facility] helped some; she would like to repeat cervical esi if appropriate/fim  . Back Pain    HISTORY OF PRESENT ILLNESS:  Maria Brennan is a 72 year old woman with insomnia and OSA and a new complaint of upper back pain.  Update 10/15/2017: She is reporting vertigo.   At PT they are doing vestibular therapy (sounds like Epley0.    She states it is worse left > right.    She has a strong positional component.     She is having more headaches.   Her neck pain got much better after a cervical ESI.   She did feel weaker for a few hours afterwards but then pain improved quite a bit.     She fell on her right shoulder and has had pain there since mid July due to vertigo.     She had xrays showing no Fx or much arthritis (reprot reviewed 7.01/2018) and she was found to have probable osteoporosis  She has OSA and uses CPAP some nights.    She is doing better since sinus surgery.         Update 04/22/2017: She is reporting pain in between the shoulder blades, more to the left.      Moving or turning a certain way worsens the pain.   Pain has been off/on x 1 years but worse the past 2 months.  She has tried a muscle relaxant but it did nit help.  She was unable to tolerate gabapentin.    She has mild kidney issues and is not supposed to take NSAID's.    She has not tried a steroid pack.   She also has mild anemia and is to get a GI endoscopy/colonoscopy.    She is using her CPAP nightly and has good compliance.  She sleeps okay many nights. She still has some  restless leg syndrome.   She is to have a deviated septum repair and right sinus operation soon.       Sometimes her fingers turn white and cold and are mildly painful.       I personally reviewed the MRI of the thoracic spine. 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 There is no abnormal signal additionally, there are disc protrusions to the right at T7-T8 into left at T9-T10.   From 11/20/2016: OSA:    On 05/26/2016 PSG she was found to have severe OSA (AHI = 40).   She had her CPAP titration in April 2018 and was titrated to a pressure of +7 cm.   Initially, she did well with OSA and used it nightly.  She fell asleep more rapidly with it and her snoring resolved. She was slightly less sleepy during the daytime.      However, the last month or two she has not used it much.   When she uses it, efficacy is good with an AHI = 3.6 on the 09/24/16 download (compliance was 97% at that time).   She notes rhinitis on the right.    She  has been treated with 2 rounds of antibiotics for sinusitis but still has nasal drainage.     Insomnia:   She felt insomnia did better on CPAP and now she has more insomnia, sleep onset > maintenance.   She is on zolpidem 10 mg and feels it helps.     Taking Ambien 5 mg with hydrocodone and CBD oil helps her fall asleep the best.  She could not tolerate gabapentin.    Hydroxyzine sometimes helps but she can have a hangover if she takes  RLS:   She has RLS when she lays down and if in a car a long time.    He sleep study also showed mild periodic limb movements of sleep. She can't get comfortable and has to move after a couple minutes.  The combination of night time hydrocodone and Ambien has helped.    Other:   She is on hydrocodone nightly for her back pain and it also helps her RLS.       EPWORTH SLEEPINESS SCALE  On a scale of 0 - 3 what is the chance of dozing:  Sitting and Reading:   3 Watching TV:    3 Sitting inactive in a  public place: 1 Passenger in car for one hour: 3 Lying down to rest in the afternoon: 3 Sitting and talking to someone: 0 Sitting quietly after lunch:  3 In a car, stopped in traffic:  1  Total (out of 24):   17/24 (moderate sleepiness -- while not on CPAP)  RESULTS OF PSG  IMPRESSION:  1.  Severe OSA with an AHI = 40.4.   OSA was worse while supine. 2.  Mild periodic limb movements of sleep with little impact on sleep 3.  She had a delayed sleep onset which could be due to a 'first night effect' in the sleep lab.  If persistent at home after correction of the OSA, consider a sleep aid.     RECOMMENDATIONS:  1. Advise full-night, attended, CPAP titration study to optimize therapy.  If unable to tolerate CPAP, consider positional therapy, an oral appliance or ENT consultation for surgery.   2. Avoid sedative-hypnotics which may worsen sleep apnea, alcohol and tobacco (as applicable). 3. Advise patient to avoid driving or operating hazardous machinery when sleepy. 4. A follow up appointment will be scheduled in the Sleep Clinic at [facility]. The referring provider will be notified of the results.       REVIEW OF SYSTEMS: Constitutional: No fevers, chills, sweats, or change in appetite.   Sleeping ok.   Eyes: No visual changes, double vision, eye pain Ear, nose and throat: No hearing loss, ear pain, nasal congestion, sore throat Cardiovascular: No chest pain, palpitations Respiratory: No shortness of breath at rest or with exertion.   Has OSA GastrointestinaI: No nausea, vomiting, diarrhea, abdominal pain, fecal incontinence Genitourinary: No dysuria, urinary retention or frequency.  No nocturia. Musculoskeletal: HSA mild neck pain and right shulder pain Integumentary: No rash, pruritus, skin lesions Neurological: as above Psychiatric: No depression at this time.  No anxiety Endocrine: No palpitations, diaphoresis, change in appetite, change in weigh  or increased thirst Hematologic/Lymphatic: No anemia, purpura, petechiae. Allergic/Immunologic: No itchy/runny eyes, nasal congestion, recent allergic reactions, rashes  ALLERGIES: Allergies  Allergen Reactions  . Rosuvastatin Other (See Comments)  . Codeine Itching and Nausea Only    HOME MEDICATIONS:  Current Outpatient Medications:  .  atorvastatin (LIPITOR) 80 MG tablet, Take by mouth., Disp: , Rfl:  .  Biotin 10 MG TABS, Take by mouth., Disp: , Rfl:  .  Cholecalciferol (VITAMIN D3) 2000 units TABS, Take 2,000 Units by mouth daily., Disp: , Rfl:  .  CRANBERRY PO, Take by mouth., Disp: , Rfl:  .  Cyanocobalamin (VITAMIN B-12 PO), Take 1 tablet by mouth daily., Disp: , Rfl:  .  docusate sodium (COLACE) 100 MG capsule, Take 1 capsule (100 mg total) by mouth 2 (two) times daily., Disp: 10 capsule, Rfl: 0 .  DULoxetine (CYMBALTA) 30 MG capsule, Take 30 mg by mouth 2 (two) times daily., Disp: , Rfl: 0 .  Exenatide ER (BYDUREON) 2 MG PEN, Inject into the skin., Disp: , Rfl:  .  ferrous sulfate 325 (65 FE) MG EC tablet, Take 325 mg by mouth 3 (three) times daily with meals., Disp: , Rfl:  .  GLUCOSAMINE-CHONDROITIN PO, Take 1 tablet by mouth 2 (two) times daily., Disp: , Rfl:  .  HYDROcodone-acetaminophen (NORCO/VICODIN) 5-325 MG tablet, Take 1 tablet by mouth at bedtime., Disp: 30 tablet, Rfl: 0 .  iron polysaccharides (NIFEREX) 150 MG capsule, Take by mouth., Disp: , Rfl:  .  magnesium oxide (MAG-OX) 400 MG tablet, Take 400 mg by mouth daily., Disp: , Rfl:  .  metFORMIN (GLUCOPHAGE) 500 MG tablet, Take 500 mg by mouth daily., Disp: , Rfl: 0 .  omeprazole (PRILOSEC) 40 MG capsule, Take 40 mg by mouth every evening. , Disp: , Rfl: 0 .  polyethylene glycol (MIRALAX / GLYCOLAX) packet, Take 17 g by mouth 2 (two) times daily., Disp: 14 each, Rfl: 0 .  Potassium 99 MG TABS, Take 99 mg by mouth daily., Disp: , Rfl:  .  triamterene-hydrochlorothiazide (MAXZIDE-25) 37.5-25 MG tablet, Take 0.5  tablets by mouth daily. , Disp: , Rfl:  .  Turmeric 500 MG TABS, Take by mouth., Disp: , Rfl:  .  UNABLE TO FIND, Collagen Bone Powder, Disp: , Rfl:  .  zolpidem (AMBIEN) 5 MG tablet, Take 1 tablet (5 mg total) by mouth at bedtime., Disp: 30 tablet, Rfl: 5  PAST MEDICAL HISTORY: Past Medical History:  Diagnosis Date  . Arthritis    oa  . Diabetes mellitus without complication ([place])   . Family history of adverse reaction to anesthesia    mother and sister slow to awaken   . GERD (gastroesophageal reflux disease)   . History of hiatal hernia   . Hypertension   . MVP (mitral valve prolapse)   . PONV (postoperative nausea and vomiting) yrs ago    PAST SURGICAL HISTORY: Past Surgical History:  Procedure Laterality Date  . ABDOMINAL HYSTERECTOMY     partial  . BREAST SURGERY Bilateral    implants  . rotator  cuff Right   . 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 Heart disease Mother   . Diabetes Mother   . Migraines Mother   . Diabetes Father   . Non-Hodgkin's lymphoma Father   . Urticaria Father   . Urticaria Sister   . Asthma Brother   . Urticaria Brother   . Angioedema Brother   . Eczema Maternal Grandmother   . Allergic rhinitis Daughter   . Eczema Daughter   . Lupus Daughter   . Eczema Grandchild   . Migraines Grandchild     SOCIAL HISTORY:  Social  History   Socioeconomic History  . Marital status: Married    Spouse name: Not on file  . Number of children: Not on file  . Years of education: Not on file  . Highest education level: Not on file  Occupational History  . Not on file  Social Needs  . Financial resource strain: Not on file  . Food insecurity:    Worry: Not on file    Inability: Not on file  . Transportation needs:    Medical: Not on file      Non-medical: Not on file  Tobacco Use  . Smoking status: Current Every Day Smoker    Packs/day: 0.50    Years: 53.00    Pack years: 26.50    Types: Cigarettes  . Smokeless tobacco: Never Used  Substance and Sexual Activity  . Alcohol use: No  . Drug use: No  . Sexual activity: Not on file  Lifestyle  . Physical activity:    Days per week: Not on file    Minutes per session: Not on file  . Stress: Not on file  Relationships  . Social connections:    Talks on phone: Not on file    Gets together: Not on file    Attends religious service: Not on file    Active member of club or organization: Not on file    Attends meetings of clubs or organizations: Not on file    Relationship status: Not on file  . Intimate partner violence:    Fear of current or ex partner: Not on file    Emotionally abused: Not on file    Physically abused: Not on file    Forced sexual activity: Not on file  Other Topics Concern  . Not on file  Social History Narrative  . Not on file     PHYSICAL EXAM  Vitals:   10/15/17 0914  BP: (!) 106/56  Pulse: 72  Resp: 18  Weight: 186 lb 8 oz (84.6 kg)  Height: 5\' 7"  (1.702 m)    Body mass index is 29.21 kg/m.   General: The patient is well-developed and well-nourished and in no acute distress  HEENT:  [place]/AT.   Neck mildly tender  Musculoskeletal:   Tender over trovchanteric bursa.  On the rightt.      Neurologic Exam  Mental status: The patient is alert and oriented x 3 at the time of the examination. The patient has apparent normal recent and remote memory, with an apparently normal attention span and concentration ability.   Speech is normal.  Cranial nerves: Extraocular movements are full.  Facial strength and sensation is normal.  Trapezius and sternocleidomastoid strength is normal. No dysarthria is noted.  The tongue is midline, and the patient has symmetric elevation of the soft palate. Mild reduced left hearing.  Motor:  Muscle bulk  is normal.   Muscle tone is norma;.   Strength is 5/5  Sensory: Sensory testing is intact to soft touch and vibration sensation in all 4 extremities.  Coordination: Cerebellar testing reveals good finger-nose-finger and heel-to-shin bilaterally.  Gait and station: Station is normal.   Gait is normal. Tandem gait is normal. Romberg is negative.   Reflexes: Deep tendon reflexes are symmetric and normal bilaterally.     Dix Hallpike maneuver evoked vertigo (L>R position) but not nystsgmus.      DIAGNOSTIC DATA (LABS, IMAGING, TESTING) - I reviewed patient records, labs, notes, testing and imaging myself where available.  Lab Results  Component Value Date   WBC 7.0 05/07/2017   HGB 12.6 05/07/2017   HCT 37.7 05/07/2017   MCV 92 05/07/2017   PLT 317 05/07/2017      Component Value Date/Time   NA 136 11/21/2015 0429   K 4.5 11/21/2015 0429   CL 105 11/21/2015 0429   CO2 26 11/21/2015 0429   GLUCOSE 150 (H) 11/21/2015 0429   BUN 18 11/21/2015 0429   CREATININE 0.89 11/21/2015 0429   CALCIUM 8.9 11/21/2015 0429   GFRNONAA >60 11/21/2015 0429   GFRAA >60 11/21/2015 0429   No results found for: CHOL, HDL, LDLCALC, LDLDIRECT, TRIG, CHOLHDL Lab Results  Component Value Date   HGBA1C 6.0 (H) 11/09/2015       ASSESSMENT AND PLAN  Obstructive sleep apnea treated with continuous positive airway pressure (CPAP)  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   Inject the right subacromial bursa with 40 mg DepoMedrol in marcaine using sterile technique.   She felt better the next day.        2.    Continue CPAP.  3.    Repeat C7T1 ESI.   Refill hydrocodone  4.    She will return to see me in 5 months or prn if there are new or worsening neurologic symptoms.  Yides Saidi A.  Felecia Shelling, MD, PhD 09/15/9379, 0:17 AM Certified in Neurology, Clinical Neurophysiology, Sleep Medicine, Pain Medicine and Neuroimaging  [facility] [address] [place], [place] [address] [phone]

## 2017-10-16 ENCOUNTER — Ambulatory Visit: Payer: Medicare Other | Admitting: Neurology

## 2017-11-06 ENCOUNTER — Ambulatory Visit
Admission: RE | Admit: 2017-11-06 | Discharge: 2017-11-06 | Disposition: A | Discharge status: Home / Self Care | Payer: Medicare Other | Source: Ambulatory Visit | Attending: Neurology | Admitting: Neurology

## 2017-11-06 DIAGNOSIS — M5124 Other intervertebral disc displacement, thoracic region: Secondary | ICD-10-CM

## 2017-11-06 DIAGNOSIS — M5412 Radiculopathy, cervical region: Secondary | ICD-10-CM

## 2017-11-06 MED ORDER — TRIAMCINOLONE ACETONIDE 40 MG/ML IJ SUSP (RADIOLOGY)
60.0000 mg | Freq: Once | INTRAMUSCULAR | Status: AC
  Administered 2017-11-06: 60 mg via EPIDURAL

## 2017-11-06 MED ORDER — IOPAMIDOL (ISOVUE-M 300) INJECTION 61%
1.0000 mL | Freq: Once | INTRAMUSCULAR | Status: AC | PRN
  Administered 2017-11-06: 1 mL via EPIDURAL

## 2017-11-12 ENCOUNTER — Other Ambulatory Visit: Payer: Self-pay

## 2017-11-12 ENCOUNTER — Ambulatory Visit (INDEPENDENT_AMBULATORY_CARE_PROVIDER_SITE_OTHER): Payer: Medicare Other | Admitting: Neurology

## 2017-11-12 ENCOUNTER — Encounter: Payer: Self-pay | Admitting: Neurology

## 2017-11-12 VITALS — BP 125/73 | HR 95 | Resp 18 | Ht 67.0 in | Wt 182.5 lb

## 2017-11-12 DIAGNOSIS — G4733 Obstructive sleep apnea (adult) (pediatric): Secondary | ICD-10-CM

## 2017-11-12 DIAGNOSIS — M5432 Sciatica, left side: Secondary | ICD-10-CM | POA: Insufficient documentation

## 2017-11-12 DIAGNOSIS — M25511 Pain in right shoulder: Secondary | ICD-10-CM | POA: Diagnosis not present

## 2017-11-12 DIAGNOSIS — Z9989 Dependence on other enabling machines and devices: Secondary | ICD-10-CM

## 2017-11-12 DIAGNOSIS — M5412 Radiculopathy, cervical region: Secondary | ICD-10-CM | POA: Diagnosis not present

## 2017-11-12 MED ORDER — HYDROCODONE-ACETAMINOPHEN 5-325 MG PO TABS: 1.0000 | ORAL_TABLET | Freq: Every day | ORAL | 0 refills | Status: DC

## 2017-11-12 NOTE — Progress Notes (Signed)
[facility]  PATIENT: Maria Brennan DOB: [birth year]  REFERRING DOCTOR OR PCP:  [name] SOURCE: patient, notes form [name], labwork from [facility]  _________________________________   HISTORICAL  CHIEF COMPLAINT:  Chief Complaint  Patient presents with  . Sleep Apnea    Sts. neck pain is much improved following 2nd cervical esi. Today she is here with new c/o lbp onset 3 wks. ago, first noted upon waking up.  Had been moving heavy boxes and doing yardwork the wk. prior./fim  . Neck Pain    HISTORY OF PRESENT ILLNESS:  Maria Brennan is a 72 y.o. woman with insomnia and OSA and spine pain.  Update 11/12/2017: Her neck pain improved after a cervical spine ESI.   However, her LB is hurting more left >>right.    She is wearing a brace without benefit.    Pain came on suddenly 3 weeks ago.    A heating pad helps slightly. Pain increases with sitting, especially on the left keg and does better on her feet or laying down.       She notes some weakness in the left leg  An MRI from 2000 shows bulging at L3L4 and L4L45 and protrusion at L5S1.      Her shoulder pain improved  She has OSA but is not using it at all now.   For a while she used it intermittently.     Update 10/15/2017: She is reporting vertigo.   At PT they are doing vestibular therapy (sounds like Epley0.    She states it is worse left > right.    She has a strong positional component.     She is having more headaches.   Her neck pain got much better after a cervical ESI.   She did feel weaker for a few hours afterwards but then pain improved quite a bit.     She fell on her right shoulder and has had pain there since mid July due to vertigo.     She had xrays showing no Fx or much arthritis (report reviewed 09/17/2017) and she was found to have probable osteoporosis  She has OSA and uses CPAP some nights.    She is doing better since sinus surgery.         Update 04/22/2017: She is reporting  pain in between the shoulder blades, more to the left.      Moving or turning a certain way worsens the pain.   Pain has been off/on x 1 years but worse the past 2 months.  She has tried a muscle relaxant but it did nit help.  She was unable to tolerate gabapentin.    She has mild kidney issues and is not supposed to take NSAID's.    She has not tried a steroid pack.   She also has mild anemia and is to get a GI endoscopy/colonoscopy.    She is using her CPAP nightly and has good compliance.  She sleeps okay many nights. She still has some restless leg syndrome.   She is to have a deviated septum repair and right sinus operation soon.       Sometimes her fingers turn white and cold and are mildly painful.       I personally reviewed the MRI of the thoracic spine. 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 There is no abnormal signal additionally, there are disc protrusions to the right at T7-T8 into  left at T9-T10.   From 11/20/2016: OSA:    On 05/26/2016 PSG she was found to have severe OSA (AHI = 40).   She had her CPAP titration in April 2018 and was titrated to a pressure of +7 cm.   Initially, she did well with OSA and used it nightly.  She fell asleep more rapidly with it and her snoring resolved. She was slightly less sleepy during the daytime.      However, the last month or two she has not used it much.   When she uses it, efficacy is good with an AHI = 3.6 on the 09/24/16 download (compliance was 97% at that time).   She notes rhinitis on the right.    She has been treated with 2 rounds of antibiotics for sinusitis but still has nasal drainage.     Insomnia:   She felt insomnia did better on CPAP and now she has more insomnia, sleep onset > maintenance.   She is on zolpidem 10 mg and feels it helps.     Taking Ambien 5 mg with hydrocodone and CBD oil helps her fall asleep the best.  She could not tolerate gabapentin.    Hydroxyzine sometimes helps but she  can have a hangover if she takes  RLS:   She has RLS when she lays down and if in a car a long time.    He sleep study also showed mild periodic limb movements of sleep. She can't get comfortable and has to move after a couple minutes.  The combination of night time hydrocodone and Ambien has helped.    Other:   She is on hydrocodone nightly for her back pain and it also helps her RLS.       EPWORTH SLEEPINESS SCALE  On a scale of 0 - 3 what is the chance of dozing:  Sitting and Reading:   3 Watching TV:    3 Sitting inactive in a public place: 1 Passenger in car for one hour: 3 Lying down to rest in the afternoon: 3 Sitting and talking to someone: 0 Sitting quietly after lunch:  3 In a car, stopped in traffic:  1  Total (out of 24):   17/24 (moderate sleepiness -- while not on CPAP)  RESULTS OF PSG  IMPRESSION:  1.  Severe OSA with an AHI = 40.4.   OSA was worse while supine. 2.  Mild periodic limb movements of sleep with little impact on sleep 3.  She had a delayed sleep onset which could be due to a 'first night effect' in the sleep lab.  If persistent at home after correction of the OSA, consider a sleep aid.     RECOMMENDATIONS:  1. Advise full-night, attended, CPAP titration study to optimize therapy.  If unable to tolerate CPAP, consider positional therapy, an oral appliance or ENT consultation for surgery.   2. Avoid sedative-hypnotics which may worsen sleep apnea, alcohol and tobacco (as applicable). 3. Advise patient to avoid driving or operating hazardous machinery when sleepy. 4. A follow up appointment will be scheduled in the Sleep Clinic at [facility]. The referring provider will be notified of the results.       REVIEW OF SYSTEMS: Constitutional: No fevers, chills, sweats, or change in appetite.   Sleeping ok.   Eyes: No visual changes, double vision, eye pain Ear, nose and throat: No hearing loss, ear pain, nasal congestion, sore  throat Cardiovascular: No chest pain, palpitations Respiratory: No shortness  of breath at rest or with exertion.   Has OSA GastrointestinaI: No nausea, vomiting, diarrhea, abdominal pain, fecal incontinence Genitourinary: No dysuria, urinary retention or frequency.  No nocturia. Musculoskeletal: HSA mild neck pain and right shulder pain Integumentary: No rash, pruritus, skin lesions Neurological: as above Psychiatric: No depression at this time.  No anxiety Endocrine: No palpitations, diaphoresis, change in appetite, change in weigh or increased thirst Hematologic/Lymphatic: No anemia, purpura, petechiae. Allergic/Immunologic: No itchy/runny eyes, nasal congestion, recent allergic reactions, rashes  ALLERGIES: Allergies  Allergen Reactions  . Rosuvastatin Other (See Comments)  . Codeine Itching and Nausea Only    HOME MEDICATIONS:  Current Outpatient Medications:  .  atorvastatin (LIPITOR) 80 MG tablet, Take by mouth., Disp: , Rfl:  .  Biotin 10 MG TABS, Take by mouth., Disp: , Rfl:  .  Cholecalciferol (VITAMIN D3) 2000 units TABS, Take 2,000 Units by mouth daily., Disp: , Rfl:  .  CRANBERRY PO, Take by mouth., Disp: , Rfl:  .  Cyanocobalamin (VITAMIN B-12 PO), Take 1 tablet by mouth daily., Disp: , Rfl:  .  docusate sodium (COLACE) 100 MG capsule, Take 1 capsule (100 mg total) by mouth 2 (two) times daily., Disp: 10 capsule, Rfl: 0 .  DULoxetine (CYMBALTA) 30 MG capsule, Take 30 mg by mouth 2 (two) times daily., Disp: , Rfl: 0 .  Exenatide ER (BYDUREON) 2 MG PEN, Inject into the skin., Disp: , Rfl:  .  HYDROcodone-acetaminophen (NORCO/VICODIN) 5-325 MG tablet, Take 1 tablet by mouth at bedtime., Disp: 30 tablet, Rfl: 0 .  iron polysaccharides (NIFEREX) 150 MG capsule, Take by mouth., Disp: , Rfl:  .  magnesium oxide (MAG-OX) 400 MG tablet, Take 400 mg by mouth daily., Disp: , Rfl:  .  metFORMIN (GLUCOPHAGE) 500 MG tablet, Take 500 mg by mouth daily., Disp: , Rfl: 0 .   omeprazole (PRILOSEC) 40 MG capsule, Take 40 mg by mouth every evening. , Disp: , Rfl: 0 .  Potassium 99 MG TABS, Take 99 mg by mouth daily., Disp: , Rfl:  .  triamterene-hydrochlorothiazide (MAXZIDE-25) 37.5-25 MG tablet, Take 0.5 tablets by mouth daily. , Disp: , Rfl:  .  Turmeric 500 MG TABS, Take by mouth., Disp: , Rfl:  .  UNABLE TO FIND, Collagen Bone Powder, Disp: , Rfl:  .  zolpidem (AMBIEN) 5 MG tablet, Take 1 tablet (5 mg total) by mouth at bedtime., Disp: 30 tablet, Rfl: 5 .  GLUCOSAMINE-CHONDROITIN PO, Take 1 tablet by mouth 2 (two) times daily., Disp: , Rfl:   PAST MEDICAL HISTORY: Past Medical History:  Diagnosis Date  . Arthritis    oa  . Diabetes mellitus without complication ([place])   . Family history of adverse reaction to anesthesia    mother and sister slow to awaken   . GERD (gastroesophageal reflux disease)   . History of hiatal hernia   . Hypertension   . MVP (mitral valve prolapse)   . PONV (postoperative nausea and vomiting) yrs ago    PAST SURGICAL HISTORY: Past Surgical History:  Procedure Laterality Date  . ABDOMINAL HYSTERECTOMY     partial  . BREAST SURGERY Bilateral    implants  . rotator  cuff Right   . 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 Heart disease Mother   .  Diabetes Mother   . Migraines Mother   . Diabetes Father   . Non-Hodgkin's lymphoma Father   . Urticaria Father   . Urticaria Sister   . Asthma Brother   . Urticaria Brother   . Angioedema Brother   . Eczema Maternal Grandmother   . Allergic rhinitis Daughter   . Eczema Daughter   . Lupus Daughter   . Eczema Grandchild   . Migraines Grandchild     SOCIAL HISTORY:  Social History   Socioeconomic History  . Marital status: Married    Spouse name: Not on file  .  Number of children: Not on file  . Years of education: Not on file  . Highest education level: Not on file  Occupational History  . Not on file  Social Needs  . Financial resource strain: Not on file  . Food insecurity:    Worry: Not on file    Inability: Not on file  . Transportation needs:    Medical: Not on file    Non-medical: Not on file  Tobacco Use  . Smoking status: Current Every Day Smoker    Packs/day: 0.50    Years: 53.00    Pack years: 26.50    Types: Cigarettes  . Smokeless tobacco: Never Used  Substance and Sexual Activity  . Alcohol use: No  . Drug use: No  . Sexual activity: Not on file  Lifestyle  . Physical activity:    Days per week: Not on file    Minutes per session: Not on file  . Stress: Not on file  Relationships  . Social connections:    Talks on phone: Not on file    Gets together: Not on file    Attends religious service: Not on file    Active member of club or organization: Not on file    Attends meetings of clubs or organizations: Not on file    Relationship status: Not on file  . Intimate partner violence:    Fear of current or ex partner: Not on file    Emotionally abused: Not on file    Physically abused: Not on file    Forced sexual activity: Not on file  Other Topics Concern  . Not on file  Social History Narrative  . Not on file     PHYSICAL EXAM  Vitals:   11/12/17 1323  BP: 125/73  Pulse: 95  Resp: 18  Weight: 182 lb 8 oz (82.8 kg)  Height: 5\' 7"  (1.702 m)    Body mass index is 28.58 kg/m.   General: The patient is well-developed and well-nourished and in no acute distress  HEENT:  [place]/AT.   There is no significant cervical tenderness  Musculoskeletal:   She has tenderness over the left piriformis muscle.  There is no tenderness in the lumbar spine and only mild tenderness in the paraspinal muscles.  No trochanteric bursa tenderness       Neurologic Exam  Mental status: The patient is alert and oriented x 3 at  the time of the examination. The patient has apparent normal recent and remote memory, with an apparently normal attention span and concentration ability.   Speech is normal.  Cranial nerves: Extraocular movements are full.  Facial strength and sensation was normal.  Trapezius strength was normal.  The tongue is midline, and the patient has symmetric elevation of the soft palate.  She has mild reduced left hearing.  Motor:  Muscle bulk is normal.   Muscle tone  is norma;.   Strength is 5/5  Sensory: Intact sensation to touch and vibration in the arms and legs  Coordination: Cerebellar testing reveals good finger-nose-finger and heel-to-shin bilaterally.  Gait and station: Station is normal.   Gait is normal. Tandem gait is normal. Romberg is negative.   Reflexes: Deep tendon reflexes are symmetric and normal bilaterally.        DIAGNOSTIC DATA (LABS, IMAGING, TESTING) - I reviewed patient records, labs, notes, testing and imaging myself where available.  Lab Results  Component Value Date   WBC 7.0 05/07/2017   HGB 12.6 05/07/2017   HCT 37.7 05/07/2017   MCV 92 05/07/2017   PLT 317 05/07/2017      Component Value Date/Time   NA 136 11/21/2015 0429   K 4.5 11/21/2015 0429   CL 105 11/21/2015 0429   CO2 26 11/21/2015 0429   GLUCOSE 150 (H) 11/21/2015 0429   BUN 18 11/21/2015 0429   CREATININE 0.89 11/21/2015 0429   CALCIUM 8.9 11/21/2015 0429   GFRNONAA >60 11/21/2015 0429   GFRAA >60 11/21/2015 0429   No results found for: CHOL, HDL, LDLCALC, LDLDIRECT, TRIG, CHOLHDL Lab Results  Component Value Date   HGBA1C 6.0 (H) 11/09/2015       ASSESSMENT AND PLAN  Obstructive sleep apnea treated with continuous positive airway pressure (CPAP)  Left sided sciatica  Acute pain of right shoulder  Cervical radiculopathy   1.   Inject the left piriformis muscle with 60 mg DepoMedrol in marcaine using sterile technique.    2.    I advised her to get back on CPAP therapy..    3.     Refill hydrocodone  4.    She will return to see me in 4-5 months or prn if there are new or worsening neurologic symptoms.  Seldon Barrell A. Felecia Shelling, MD, PhD, FAAN Certified in Neurology, Clinical Neurophysiology, Sleep Medicine, Pain Medicine and Neuroimaging Director, [facility] [address], [place],  [address] [phone]

## 2017-11-20 ENCOUNTER — Other Ambulatory Visit (HOSPITAL_COMMUNITY): Payer: Self-pay | Admitting: Otolaryngology

## 2017-11-20 DIAGNOSIS — H9319 Tinnitus, unspecified ear: Secondary | ICD-10-CM

## 2017-11-20 DIAGNOSIS — R42 Dizziness and giddiness: Secondary | ICD-10-CM

## 2017-11-30 ENCOUNTER — Ambulatory Visit (HOSPITAL_COMMUNITY)
Admission: RE | Admit: 2017-11-30 | Discharge: 2017-11-30 | Disposition: A | Discharge status: Home / Self Care | Payer: Medicare Other | Source: Ambulatory Visit | Attending: Otolaryngology | Admitting: Otolaryngology

## 2017-11-30 DIAGNOSIS — R42 Dizziness and giddiness: Secondary | ICD-10-CM

## 2017-11-30 DIAGNOSIS — H9319 Tinnitus, unspecified ear: Secondary | ICD-10-CM

## 2017-11-30 DIAGNOSIS — H8143 Vertigo of central origin, bilateral: Secondary | ICD-10-CM | POA: Insufficient documentation

## 2017-11-30 MED ORDER — GADOBUTROL 1 MMOL/ML IV SOLN
10.0000 mL | Freq: Once | INTRAVENOUS | Status: AC | PRN
  Administered 2017-11-30: 8 mL via INTRAVENOUS

## 2017-12-01 LAB — POCT I-STAT CREATININE: Creatinine, Ser: 1 mg/dL (ref 0.44–1.00)

## 2017-12-16 ENCOUNTER — Telehealth: Payer: Self-pay | Admitting: Neurology

## 2017-12-16 MED ORDER — HYDROCODONE-ACETAMINOPHEN 5-325 MG PO TABS: 1.0000 | ORAL_TABLET | Freq: Every day | ORAL | 0 refills | Status: DC

## 2017-12-16 NOTE — Telephone Encounter (Signed)
Pt requesting a refills for HYDROcodone-acetaminophen (NORCO/VICODIN) 5-325 MG tablet stating she is needing this later today if at all possible, Please call once available. Pt aware Dr. Felecia Shelling is out of the office today

## 2017-12-16 NOTE — Telephone Encounter (Signed)
Rx. awaiting VP sig, as RAS is ooo today/fim

## 2017-12-16 NOTE — Telephone Encounter (Signed)
Rx. up front [facility]/fim 

## 2018-01-18 ENCOUNTER — Telehealth: Payer: Self-pay | Admitting: Neurology

## 2018-01-18 MED ORDER — HYDROCODONE-ACETAMINOPHEN 5-325 MG PO TABS: 1.0000 | ORAL_TABLET | Freq: Every day | ORAL | 0 refills | Status: DC

## 2018-01-18 NOTE — Telephone Encounter (Signed)
Pt has called for a refill on her HYDROcodone-acetaminophen (NORCO/VICODIN) 5-325 MG tablet

## 2018-01-18 NOTE — Telephone Encounter (Signed)
Placed printed/signed rx up front for pick up.

## 2018-01-18 NOTE — Addendum Note (Signed)
Addended by: Hope Pigeon on: 01/18/2018 09:32 AM   Modules accepted: Orders

## 2018-01-18 NOTE — Telephone Encounter (Signed)
Spoke with Dr. Jaynee Eagles- she was okay with refilling since Dr. Felecia Shelling out of the office. She is the [facility] this am.

## 2018-01-18 NOTE — Telephone Encounter (Signed)
Checked drug registry. Pt last refilled rx 12/17/17 #30, not receiving from other MD's. Last seen 11/12/17 and next f/u 03/17/18.

## 2018-02-15 ENCOUNTER — Telehealth: Payer: Self-pay | Admitting: Neurology

## 2018-02-15 MED ORDER — HYDROCODONE-ACETAMINOPHEN 5-325 MG PO TABS: 1.0000 | ORAL_TABLET | Freq: Every day | ORAL | 0 refills | Status: DC

## 2018-02-15 NOTE — Telephone Encounter (Signed)
Patient requesting refill of HYDROcodone-acetaminophen (NORCO/VICODIN) 5-325 MG tablet.

## 2018-02-15 NOTE — Telephone Encounter (Signed)
Rx. up front [facility]/fim 

## 2018-02-15 NOTE — Telephone Encounter (Signed)
Rx. awaiting RAS sig/fim 

## 2018-02-15 NOTE — Addendum Note (Signed)
Addended by: France Ravens I on: 02/15/2018 10:23 AM   Modules accepted: Orders

## 2018-03-17 ENCOUNTER — Encounter: Payer: Self-pay | Admitting: Neurology

## 2018-03-17 ENCOUNTER — Ambulatory Visit (INDEPENDENT_AMBULATORY_CARE_PROVIDER_SITE_OTHER): Payer: Medicare Other | Admitting: Neurology

## 2018-03-17 VITALS — BP 122/73 | HR 89 | Ht 67.0 in | Wt 186.0 lb

## 2018-03-17 DIAGNOSIS — I7300 Raynaud's syndrome without gangrene: Secondary | ICD-10-CM | POA: Diagnosis not present

## 2018-03-17 DIAGNOSIS — M5412 Radiculopathy, cervical region: Secondary | ICD-10-CM

## 2018-03-17 DIAGNOSIS — G2581 Restless legs syndrome: Secondary | ICD-10-CM | POA: Diagnosis not present

## 2018-03-17 DIAGNOSIS — G4733 Obstructive sleep apnea (adult) (pediatric): Secondary | ICD-10-CM

## 2018-03-17 DIAGNOSIS — Z9989 Dependence on other enabling machines and devices: Secondary | ICD-10-CM

## 2018-03-17 MED ORDER — VERAPAMIL HCL ER 120 MG PO TBCR: 120.0000 mg | EXTENDED_RELEASE_TABLET | Freq: Every day | ORAL | 5 refills | Status: DC

## 2018-03-17 MED ORDER — HYDROCODONE-ACETAMINOPHEN 5-325 MG PO TABS: ORAL_TABLET | ORAL | 0 refills | Status: DC

## 2018-03-17 NOTE — Progress Notes (Signed)
[facility]  PATIENT: Maria Brennan DOB: 1945-12-04  REFERRING DOCTOR OR PCP:  [name] SOURCE: patient, notes form [name], labwork from [facility]  _________________________________   HISTORICAL  CHIEF COMPLAINT:  Chief Complaint  Patient presents with  . Follow-up    RM 12 with husband. Last seen 11/12/2017.   Marland Kitchen CPAP    Did not restart on CPAP    HISTORY OF PRESENT ILLNESS:  Maria Brennan is a 73 y.o. woman with insomnia and OSA and spine pain.  Update 03/17/2018: She is experiencing more neck pain, radiating down the arms, right > left.   The right C7T1 ESI helped x 3 months.    She notes stiffness in her shoulders and hips, worse in the mornings and evenings   She has reasonably good ROM in shoulders though raising overhead is painful.     She is having spells of her hands turning white, sometimes the right hand and sometimes both hands.    They become very cold.   The spells last 10-15 minutes and then resolve.   She has discomfort but not much pain.      She fell and has a new rotator cuff tear.   She had an injection by ortho with some benefit.    Update 11/12/2017: Her neck pain improved after a cervical spine ESI.   However, her LB is hurting more left >>right.    She is wearing a brace without benefit.    Pain came on suddenly 3 weeks ago.    A heating pad helps slightly. Pain increases with sitting, especially on the left keg and does better on her feet or laying down.       She notes some weakness in the left leg.  An MRI from 2000 shows bulging at [place] and L4L45 and protrusion at L5S1.      Her shoulder pain improved  She has OSA but is not using it at all now.   For a while she used it intermittently.     Update 10/15/2017: She is reporting vertigo.   At PT they are doing vestibular therapy (sounds like Epley0.    She states it is worse left > right.    She has a strong positional component.     She is having more headaches.   Her  neck pain got much better after a cervical ESI.   She did feel weaker for a few hours afterwards but then pain improved quite a bit.     She fell on her right shoulder and has had pain there since mid July due to vertigo.     She had xrays showing no Fx or much arthritis (report reviewed 09/17/2017) and she was found to have probable osteoporosis  She has OSA and uses CPAP some nights.    She is doing better since sinus surgery.         Update 04/22/2017: She is reporting pain in between the shoulder blades, more to the left.      Moving or turning a certain way worsens the pain.   Pain has been off/on x 1 years but worse the past 2 months.  She has tried a muscle relaxant but it did nit help.  She was unable to tolerate gabapentin.    She has mild kidney issues and is not supposed to take NSAID's.    She has not tried a steroid pack.   She also has mild anemia and is to get  a GI endoscopy/colonoscopy.    She is using her CPAP nightly and has good compliance.  She sleeps okay many nights. She still has some restless leg syndrome.   She is to have a deviated septum repair and right sinus operation soon.       Sometimes her fingers turn white and cold and are mildly painful.       I personally reviewed the MRI of the thoracic spine. 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 There is no abnormal signal additionally, there are disc protrusions to the right at T7-T8 into left at T9-T10.   From 11/20/2016: OSA:    On 05/26/2016 PSG she was found to have severe OSA (AHI = 40).   She had her CPAP titration in April 2018 and was titrated to a pressure of +7 cm.   Initially, she did well with OSA and used it nightly.  She fell asleep more rapidly with it and her snoring resolved. She was slightly less sleepy during the daytime.      However, the last month or two she has not used it much.   When she uses it, efficacy is good with an AHI = 3.6 on the 09/24/16 download  (compliance was 97% at that time).   She notes rhinitis on the right.    She has been treated with 2 rounds of antibiotics for sinusitis but still has nasal drainage.     Insomnia:   She felt insomnia did better on CPAP and now she has more insomnia, sleep onset > maintenance.   She is on zolpidem 10 mg and feels it helps.     Taking Ambien 5 mg with hydrocodone and CBD oil helps her fall asleep the best.  She could not tolerate gabapentin.    Hydroxyzine sometimes helps but she can have a hangover if she takes  RLS:   She has RLS when she lays down and if in a car a long time.    He sleep study also showed mild periodic limb movements of sleep. She can't get comfortable and has to move after a couple minutes.  The combination of night time hydrocodone and Ambien has helped.    Other:   She is on hydrocodone nightly for her back pain and it also helps her RLS.       EPWORTH SLEEPINESS SCALE  On a scale of 0 - 3 what is the chance of dozing:  Sitting and Reading:   3 Watching TV:    3 Sitting inactive in a public place: 1 Passenger in car for one hour: 3 Lying down to rest in the afternoon: 3 Sitting and talking to someone: 0 Sitting quietly after lunch:  3 In a car, stopped in traffic:  1  Total (out of 24):   17/24 (moderate sleepiness -- while not on CPAP)  RESULTS OF PSG  IMPRESSION:  1.  Severe OSA with an AHI = 40.4.   OSA was worse while supine. 2.  Mild periodic limb movements of sleep with little impact on sleep 3.  She had a delayed sleep onset which could be due to a 'first night effect' in the sleep lab.  If persistent at home after correction of the OSA, consider a sleep aid.     RECOMMENDATIONS:  1. Advise full-night, attended, CPAP titration study to optimize therapy.  If unable to tolerate CPAP, consider positional therapy, an oral appliance or ENT consultation for surgery.  2. Avoid sedative-hypnotics which may worsen sleep apnea, alcohol and tobacco (as  applicable). 3. Advise patient to avoid driving or operating hazardous machinery when sleepy. 4. A follow up appointment will be scheduled in the Sleep Clinic at [facility]. The referring provider will be notified of the results.       REVIEW OF SYSTEMS: Constitutional: No fevers, chills, sweats, or change in appetite.   Sleeping ok.   Eyes: No visual changes, double vision, eye pain Ear, nose and throat: No hearing loss, ear pain, nasal congestion, sore throat Cardiovascular: No chest pain, palpitations Respiratory: No shortness of breath at rest or with exertion.   Has OSA GastrointestinaI: No nausea, vomiting, diarrhea, abdominal pain, fecal incontinence Genitourinary: No dysuria, urinary retention or frequency.  No nocturia. Musculoskeletal: HSA mild neck pain and right shulder pain Integumentary: No rash, pruritus, skin lesions Neurological: as above Psychiatric: No depression at this time.  No anxiety Endocrine: No palpitations, diaphoresis, change in appetite, change in weigh or increased thirst Hematologic/Lymphatic: No anemia, purpura, petechiae. Allergic/Immunologic: No itchy/runny eyes, nasal congestion, recent allergic reactions, rashes  ALLERGIES: Allergies  Allergen Reactions  . Rosuvastatin Other (See Comments)  . Codeine Itching and Nausea Only    HOME MEDICATIONS:  Current Outpatient Medications:  .  atorvastatin (LIPITOR) 80 MG tablet, Take by mouth., Disp: , Rfl:  .  Biotin 10 MG TABS, Take by mouth., Disp: , Rfl:  .  Cholecalciferol (VITAMIN D3) 2000 units TABS, Take 2,000 Units by mouth daily., Disp: , Rfl:  .  CRANBERRY PO, Take by mouth., Disp: , Rfl:  .  DULoxetine (CYMBALTA) 30 MG capsule, Take 30 mg by mouth 2 (two) times daily., Disp: , Rfl: 0 .  Exenatide ER (BYDUREON) 2 MG PEN, Inject into the skin., Disp: , Rfl:  .  GLUCOSAMINE-CHONDROITIN PO, Take 1 tablet by mouth 2 (two) times daily., Disp: , Rfl:  .   HYDROcodone-acetaminophen (NORCO/VICODIN) 5-325 MG tablet, Take 1 tablet by mouth at bedtime., Disp: 30 tablet, Rfl: 0 .  iron polysaccharides (NIFEREX) 150 MG capsule, Take by mouth., Disp: , Rfl:  .  magnesium oxide (MAG-OX) 400 MG tablet, Take 400 mg by mouth daily., Disp: , Rfl:  .  metFORMIN (GLUCOPHAGE) 500 MG tablet, Take 500 mg by mouth daily., Disp: , Rfl: 0 .  omeprazole (PRILOSEC) 40 MG capsule, Take 40 mg by mouth every evening. , Disp: , Rfl: 0 .  Potassium 99 MG TABS, Take 99 mg by mouth daily., Disp: , Rfl:  .  triamterene-hydrochlorothiazide (MAXZIDE-25) 37.5-25 MG tablet, Take 0.5 tablets by mouth daily. , Disp: , Rfl:  .  Turmeric 500 MG TABS, Take by mouth., Disp: , Rfl:  .  UNABLE TO FIND, Collagen Bone Powder, Disp: , Rfl:  .  zolpidem (AMBIEN) 5 MG tablet, Take 1 tablet (5 mg total) by mouth at bedtime., Disp: 30 tablet, Rfl: 5 .  docusate sodium (COLACE) 100 MG capsule, Take 1 capsule (100 mg total) by mouth 2 (two) times daily. (Patient not taking: Reported on 03/17/2018), Disp: 10 capsule, Rfl: 0  PAST MEDICAL HISTORY: Past Medical History:  Diagnosis Date  . Arthritis    oa  . Diabetes mellitus without complication ([place])   . Family history of adverse reaction to anesthesia    mother and sister slow to awaken   . GERD (gastroesophageal reflux disease)   . History of hiatal hernia   . Hypertension   . MVP (mitral valve prolapse)   . PONV (  postoperative nausea and vomiting) yrs ago    PAST SURGICAL HISTORY: Past Surgical History:  Procedure Laterality Date  . ABDOMINAL HYSTERECTOMY     partial  . BREAST SURGERY Bilateral    implants  . rotator  cuff Right   . 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 Heart disease Mother   . Diabetes Mother   . Migraines  Mother   . Diabetes Father   . Non-Hodgkin's lymphoma Father   . Urticaria Father   . Urticaria Sister   . Asthma Brother   . Urticaria Brother   . Angioedema Brother   . Eczema Maternal Grandmother   . Allergic rhinitis Daughter   . Eczema Daughter   . Lupus Daughter   . Eczema Grandchild   . Migraines Grandchild     SOCIAL HISTORY:  Social History   Socioeconomic History  . Marital status: Married    Spouse name: Not on file  . Number of children: Not on file  . Years of education: Not on file  . Highest education level: Not on file  Occupational History  . Not on file  Social Needs  . Financial resource strain: Not on file  . Food insecurity:    Worry: Not on file    Inability: Not on file  . Transportation needs:    Medical: Not on file    Non-medical: Not on file  Tobacco Use  . Smoking status: Current Every Day Smoker    Packs/day: 0.50    Years: 53.00    Pack years: 26.50    Types: Cigarettes  . Smokeless tobacco: Never Used  Substance and Sexual Activity  . Alcohol use: No  . Drug use: No  . Sexual activity: Not on file  Lifestyle  . Physical activity:    Days per week: Not on file    Minutes per session: Not on file  . Stress: Not on file  Relationships  . Social connections:    Talks on phone: Not on file    Gets together: Not on file    Attends religious service: Not on file    Active member of club or organization: Not on file    Attends meetings of clubs or organizations: Not on file    Relationship status: Not on file  . Intimate partner violence:    Fear of current or ex partner: Not on file    Emotionally abused: Not on file    Physically abused: Not on file    Forced sexual activity: Not on file  Other Topics Concern  . Not on file  Social History Narrative  . Not on file     PHYSICAL EXAM  Vitals:   03/17/18 1017  BP: 122/73  Pulse: 89  Weight: 186 lb (84.4 kg)  Height: 5\' 7"  (1.702 m)    Body mass index is 29.13  kg/m.   General: The patient is well-developed and well-nourished and in no acute distress  HEENT:  Bend/AT.   She has mid to lowert cervical tenderness  Musculoskeletal:   Some piriformis tenderness.   Reduced shoulder ROM   Neurologic Exam  Mental status: The patient is alert and oriented x 3 at the time of the examination. The patient has apparent normal recent and remote memory, with an  apparently normal attention span and concentration ability.   Speech is normal.  Cranial nerves: Extraocular movements are full.  Normal facial strength.  Normal trapezius strength.    The tongue is midline, and the patient has symmetric elevation of the soft palate.  She has mild reduced left hearing.  Motor:  Muscle bulk is normal.   Muscle tone is norma;.   Strength is 5/5  Sensory: Intact sensation to touch and vibration in the arms and legs  Coordination: Cerebellar testing shows good Finger nose finger and heel-to-shin bilaterally.  Gait and station: Station is normal.   Gait is normal.   Tandem slightly wide  Romberg is negative.   Reflexes: Deep tendon reflexes are symmetric and normal bilaterally.         DIAGNOSTIC DATA (LABS, IMAGING, TESTING) - I reviewed patient records, labs, notes, testing and imaging myself where available.  Lab Results  Component Value Date   WBC 7.0 05/07/2017   HGB 12.6 05/07/2017   HCT 37.7 05/07/2017   MCV 92 05/07/2017   PLT 317 05/07/2017      Component Value Date/Time   NA 136 11/21/2015 0429   K 4.5 11/21/2015 0429   CL 105 11/21/2015 0429   CO2 26 11/21/2015 0429   GLUCOSE 150 (H) 11/21/2015 0429   BUN 18 11/21/2015 0429   CREATININE 1.00 11/30/2017 1912   CALCIUM 8.9 11/21/2015 0429   GFRNONAA >60 11/21/2015 0429   GFRAA >60 11/21/2015 0429   No results found for: CHOL, HDL, LDLCALC, LDLDIRECT, TRIG, CHOLHDL Lab Results  Component Value Date   HGBA1C 6.0 (H) 11/09/2015       ASSESSMENT AND PLAN  Cervical  radiculopathy  Obstructive sleep apnea treated with continuous positive airway pressure (CPAP)  Restless leg syndrome  Raynaud's phenomenon without gangrene   1.   Schedule C7T1 ESI (saw Dr, [name] last time).   If benefit is not long lasting, consaider referral to neurosurgery.    2.    We discussed getting back on CPAP therapy..  3.     Refill hydrocodone, will increase to bid.    4.    She will return to see me in 5 months or prn if there are new or worsening neurologic symptoms.  Milda Lindvall A. Felecia Shelling, MD, PhD, FAAN Certified in Neurology, Clinical Neurophysiology, Sleep Medicine, Pain Medicine and Neuroimaging Director, [place] [facility] [address], [place], [place] [address] [phone]

## 2018-03-22 LAB — C-REACTIVE PROTEIN: CRP: 11 mg/L — ABNORMAL HIGH (ref 0–10)

## 2018-03-22 LAB — ANA W/REFLEX: Anti Nuclear Antibody(ANA): POSITIVE — AB

## 2018-03-22 LAB — ENA+DNA/DS+SJORGEN'S
DSDNA AB: 26 [IU]/mL — AB (ref 0–9)
ENA SM Ab Ser-aCnc: <0.2 AI (ref 0.0–0.9)

## 2018-03-22 LAB — CRYOGLOBULIN

## 2018-03-22 LAB — SEDIMENTATION RATE: SED RATE: 16 mm/h (ref 0–40)

## 2018-03-25 ENCOUNTER — Telehealth: Payer: Self-pay | Admitting: *Deleted

## 2018-03-25 DIAGNOSIS — I7300 Raynaud's syndrome without gangrene: Secondary | ICD-10-CM

## 2018-03-25 DIAGNOSIS — R768 Other specified abnormal immunological findings in serum: Secondary | ICD-10-CM

## 2018-03-25 NOTE — Telephone Encounter (Signed)
-----   Message from Britt Bottom, MD sent at 03/24/2018  6:47 PM EST ----- Please let her know that the one test (ANA) was abnormal.  There are a high number of false positives with this test but it can also be positive in autoimmune disorders.  However, as she also has the symptoms in her hands (Raynaud's) I would like her to see a rheumatologist for a consultation regarding this.

## 2018-03-25 NOTE — Telephone Encounter (Signed)
Called and spoke with patient about results per Dr. Felecia Shelling note. She is agreeable to be referred to Rheumatology. She will call back if she does not hear about scheduling. She verbalized understanding.  Placed referral to rheumatology.

## 2018-04-20 ENCOUNTER — Other Ambulatory Visit: Payer: Self-pay | Admitting: Neurology

## 2018-04-20 MED ORDER — HYDROCODONE-ACETAMINOPHEN 5-325 MG PO TABS: ORAL_TABLET | ORAL | 0 refills | Status: DC

## 2018-04-20 NOTE — Telephone Encounter (Signed)
Pt is in need of a refill for her HYDROcodone-acetaminophen (NORCO/VICODIN) 5-325 MG tablet sent to [facility] in [place]

## 2018-04-21 NOTE — Progress Notes (Deleted)
Office Visit Note  Patient: Maria Brennan             Date of Birth: [birth year]           MRN: [id number]             PCP: Kristopher Glee., MD Referring: Britt Bottom, MD Visit Date: 05/05/2018 Occupation: @GUAROCC @  Subjective:  No chief complaint on file.   History of Present Illness: Maria Brennan is a 73 y.o. female ***   Activities of Daily Living:  Patient reports morning stiffness for *** {minute/hour:19697}.   Patient {ACTIONS;DENIES/REPORTS:21021675::"Denies"} nocturnal pain.  Difficulty dressing/grooming: {ACTIONS;DENIES/REPORTS:21021675::"Denies"} Difficulty climbing stairs: {ACTIONS;DENIES/REPORTS:21021675::"Denies"} Difficulty getting out of chair: {ACTIONS;DENIES/REPORTS:21021675::"Denies"} Difficulty using hands for taps, buttons, cutlery, and/or writing: {ACTIONS;DENIES/REPORTS:21021675::"Denies"}  No Rheumatology ROS completed.   PMFS History:  Patient Active Problem List   Diagnosis Date Noted  . Raynaud phenomenon 03/17/2018  . Left sided sciatica 11/12/2017  . Cervical radiculopathy 10/15/2017  . Shoulder pain, right 10/15/2017  . Immunodeficiency, unspecified ([place]) 04/29/2017  . Other allergic rhinitis 04/28/2017  . Essential hypertension 04/28/2017  . Gastroesophageal reflux disease without esophagitis 04/28/2017  . Upper back pain 04/22/2017  . 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 Other chronic sinusitis 11/20/2016  . Periodic limb movement 06/18/2016  . Obstructive sleep apnea treated with continuous positive airway pressure (CPAP) 05/27/2016  . Restless leg syndrome 05/12/2016  . Snoring 05/12/2016  . Excessive daytime sleepiness 05/12/2016  . Overweight (BMI 25.0-29.9) 11/21/2015  . S/P right TKA 11/20/2015  . Recurrent major depressive disorder, in full remission ([place]) 04/02/2015  . Insomnia 03/13/2015  . Diabetes mellitus type 2 without retinopathy ([place]) 03/13/2015  . Major depression 03/13/2015      Past Medical History:  Diagnosis Date  . Arthritis    oa  . Diabetes mellitus without complication ([place])   . Family history of adverse reaction to anesthesia    mother and sister slow to awaken   . GERD (gastroesophageal reflux disease)   . History of hiatal hernia   . Hypertension   . MVP (mitral valve prolapse)   . PONV (postoperative nausea and vomiting) yrs ago    Family History  Problem Relation Age of Onset  . Heart disease Mother   . Diabetes Mother   . Migraines Mother   . Diabetes Father   . Non-Hodgkin's lymphoma Father   . Urticaria Father   . Urticaria Sister   . Asthma Brother   . Urticaria Brother   . Angioedema Brother   . Eczema Maternal Grandmother   . Allergic rhinitis Daughter   . Eczema Daughter   . Lupus Daughter   . Eczema Grandchild   . Migraines Grandchild    Past Surgical History:  Procedure Laterality Date  . ABDOMINAL HYSTERECTOMY     partial  . BREAST SURGERY Bilateral    implants  . rotator  cuff Right   . 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   Procedure: RIGHT TOTAL KNEE ARTHROPLASTY;  Surgeon: Paralee Cancel, MD;  Location: [facility];  Service: Orthopedics;  Laterality: Right;   Social History   Social History Narrative  . Not on file   Immunization History  Administered Date(s) Administered  . Influenza, High Dose Seasonal PF 01/25/2016, 12/18/2016  . Influenza-Unspecified 11/15/2013, 12/12/2014  . Pneumococcal Conjugate-13 01/13/2014  . Pneumococcal Polysaccharide-23 04/21/2005  . Tdap 06/25/2006  . Zoster 07/22/2011     Objective: Vital Signs: There were no vitals taken for this visit.  Physical Exam   Musculoskeletal Exam: ***  CDAI Exam: CDAI Score: Not documented Patient Global Assessment: Not documented; Provider Global Assessment: Not documented Swollen: Not documented; Tender: Not documented Joint Exam   Not documented   There is currently no information documented on the homunculus. Go to the  Rheumatology activity and complete the homunculus joint exam.  Investigation: Findings:  03/17/18: ANA+, dsDNA 26, RNP-, smith-, Ro-, La-, sed rate 16, CRP 11  Component     Latest Ref Rng & Units 03/17/2018  dsDNA Ab     0 - 9 IU/mL 26 (H)  ENA RNP Ab     0.0 - 0.9 AI <0.2  ENA SM Ab Ser-aCnc     0.0 - 0.9 AI <0.2  ENA SSA (RO) Ab     0.0 - 0.9 AI <0.2  ENA SSB (LA) Ab     0.0 - 0.9 AI <0.2  SEE BELOW      Comment  Anti Nuclear Antibody(ANA)     Negative Positive (A)  Sed Rate     0 - 40 mm/hr 16  CRP     0 - 10 mg/L 11 (H)  Cryoglobulin, Ql, Serum, Rflx     None detected Comment   Imaging: No results found.  Recent Labs: Lab Results  Component Value Date   WBC 7.0 05/07/2017   HGB 12.6 05/07/2017   PLT 317 05/07/2017   NA 136 11/21/2015   K 4.5 11/21/2015   CL 105 11/21/2015   CO2 26 11/21/2015   GLUCOSE 150 (H) 11/21/2015   BUN 18 11/21/2015   CREATININE 1.00 11/30/2017   CALCIUM 8.9 11/21/2015   GFRAA >60 11/21/2015    Speciality Comments: No specialty comments available.  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  No orders of the defined types were placed in this encounter.   Face-to-face time spent with patient was *** minutes.  Greater than 50% of time was spent in counseling and coordination of care.  Follow-Up Instructions: No follow-ups on file.   Ofilia Neas, PA-C  Note - This record has been created using Dragon software.  Chart creation errors have been sought, but may not always  have been located. Such creation errors do not reflect on  the standard of medical care.

## 2018-05-05 ENCOUNTER — Ambulatory Visit: Admitting: Rheumatology

## 2018-05-17 ENCOUNTER — Other Ambulatory Visit: Payer: Self-pay | Admitting: Neurology

## 2018-05-17 MED ORDER — HYDROCODONE-ACETAMINOPHEN 5-325 MG PO TABS: ORAL_TABLET | ORAL | 0 refills | Status: DC

## 2018-05-17 NOTE — Addendum Note (Signed)
Addended by: Rossie Muskrat L on: 05/17/2018 11:27 AM   Modules accepted: Orders

## 2018-05-17 NOTE — Telephone Encounter (Signed)
Pt is needing a refill on her HYDROcodone-acetaminophen (NORCO/VICODIN) 5-325 MG tablet sent to [facility] in [place].

## 2018-05-28 ENCOUNTER — Ambulatory Visit: Admitting: Rheumatology

## 2018-06-10 ENCOUNTER — Ambulatory Visit: Admitting: Rheumatology

## 2018-06-14 ENCOUNTER — Other Ambulatory Visit: Payer: Self-pay | Admitting: Neurology

## 2018-06-14 MED ORDER — HYDROCODONE-ACETAMINOPHEN 5-325 MG PO TABS: ORAL_TABLET | ORAL | 0 refills | Status: DC

## 2018-06-14 NOTE — Telephone Encounter (Signed)
Pt request refill for HYDROcodone-acetaminophen (NORCO/VICODIN) 5-325 MG tablet sent to [facility]/[place]

## 2018-07-08 ENCOUNTER — Ambulatory Visit: Admitting: Rheumatology

## 2018-07-14 LAB — HM COLONOSCOPY

## 2018-07-15 ENCOUNTER — Telehealth: Payer: Self-pay | Admitting: *Deleted

## 2018-07-15 NOTE — Telephone Encounter (Signed)
Called pt. She agreed to do doxy.me virtual visit on 07/20/18 with Dr. Felecia Shelling. I emailed her link at [email or web address].  Updated medication list/pharmacy/allergies on file.  She never took the verapamil. She was afraid of SE.  She saw PCP because they thought she had a couple tick bites and started having sx. They checked her for lyme disease.Test was negative. They advised she needs to see rheumatologist. She has appt with Dr. [name] 07/19/18. This will be her first appt.

## 2018-07-16 NOTE — Progress Notes (Signed)
Office Visit Note  Patient: Maria Brennan             Date of Birth: Oct 08, 1945           MRN: [id number]             PCP: Kristopher Glee., MD Referring: Britt Bottom, MD Visit Date: 07/19/2018 Occupation: RETIRED ADMINISTRATOR  Subjective:  Pain in multiple joints and positive ANA   History of Present Illness: Maria Brennan is a 73 y.o. female seen in consultation per request of Dr. Felecia Shelling.  According to patient she has a history of arthritis in multiple joints for several years.  She states she has had knee joint pain and discomfort for at least 15 years.  She eventually underwent right total knee replacement.  She also had recurrent problems with right shoulder joint discomfort.  She has had rotator cuff tear repair in the past which has recurred now.  She states she had a cortisone injection a month ago by Dr. Stann Mainland.  He also recommended total shoulder replacement in the future.  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  She states she has been seeing Dr. Felecia Shelling and also goes to pain management.  She has had frequent cortisone injections in the past which were in her spine and her knee joints and shoulder joints.  Now she gets cortisone injections only every 3 to 4 months in her shoulder.  She has noticed increased pain and discomfort in her hands recently with decreased grip strength.  She notices some swelling in her hands.  She is also noticed rainouts phenomenon in her hands for the last few years.  She states Dr. Felecia Shelling did some labs recently which were abnormal and that is the reason she was referred to me.  Patient recalls having to take bites in April for which Dr. Felecia Shelling gave her doxycycline.  That test for Lyme was negative.  Activities of Daily Living:  Patient reports morning stiffness for 1 hour.   Patient Reports nocturnal pain.  Difficulty dressing/grooming: Reports Difficulty climbing stairs: Reports Difficulty getting out of chair: Reports  Difficulty using hands for taps, buttons, cutlery, and/or writing: Reports  Review of Systems  Constitutional: Positive for fatigue. Negative for night sweats, weight gain and weight loss.  HENT: Positive for mouth dryness. Negative for mouth sores, trouble swallowing, trouble swallowing and nose dryness.   Eyes: Positive for dryness. Negative for pain, redness and visual disturbance.  Respiratory: Negative for cough, shortness of breath and difficulty breathing.   Cardiovascular: Negative for chest pain, palpitations, hypertension, irregular heartbeat and swelling in legs/feet.  Gastrointestinal: Positive for constipation. Negative for blood in stool and diarrhea.  Endocrine: Negative for increased urination.  Genitourinary: Negative for vaginal dryness.  Musculoskeletal: Positive for arthralgias, joint pain and morning stiffness. Negative for joint swelling, myalgias, muscle weakness, muscle tenderness and myalgias.  Skin: Positive for color change and hair loss. Negative for rash, skin tightness, ulcers and sensitivity to sunlight.  Allergic/Immunologic: Negative for susceptible to infections.  Neurological: Negative for dizziness, memory loss, night sweats and weakness.  Hematological: Negative for swollen glands.  Psychiatric/Behavioral: Positive for sleep disturbance. Negative for depressed mood. The patient is not nervous/anxious.     PMFS History:  Patient Active Problem List   Diagnosis Date Noted  . Raynaud phenomenon 03/17/2018  . Left sided sciatica 11/12/2017  . Cervical radiculopathy 10/15/2017  . Shoulder pain, right 10/15/2017  . Immunodeficiency, unspecified ([place]) 04/29/2017  .  Other allergic rhinitis 04/28/2017  . Essential hypertension 04/28/2017  . Gastroesophageal reflux disease without esophagitis 04/28/2017  . Upper back pain 04/22/2017  . 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 Other chronic sinusitis 11/20/2016  . 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 Obstructive sleep apnea treated with continuous positive airway pressure (CPAP) 05/27/2016  . Restless leg syndrome 05/12/2016  . Snoring 05/12/2016  . Excessive daytime sleepiness 05/12/2016  . Overweight (BMI 25.0-29.9) 11/21/2015  . S/P right TKA 11/20/2015  . Recurrent major depressive disorder, in full remission ([place]) 04/02/2015  . Insomnia 03/13/2015  . Diabetes mellitus type 2 without retinopathy ([place]) 03/13/2015  . Major depression 03/13/2015    Past Medical History:  Diagnosis Date  . Arthritis    oa  . Diabetes mellitus without complication ([place])   . Family history of adverse reaction to anesthesia    mother and sister slow to awaken   . GERD (gastroesophageal reflux disease)   . History of hiatal hernia   . Hypertension   . MVP (mitral valve prolapse)   . PONV (postoperative nausea and vomiting) yrs ago    Family History  Problem Relation Age of Onset  . Heart disease Mother   . Diabetes Mother   . Migraines Mother   . Diabetes Father   . Non-Hodgkin's lymphoma Father   . Urticaria Father   . Urticaria Sister   . Breast cancer Sister   . Asthma Brother   . Urticaria Brother   . Angioedema Brother   . Bladder Cancer Brother   . Eczema Maternal Grandmother   . Allergic rhinitis Daughter   . Lupus Daughter   . Eczema Daughter   . Heart disease Brother    Past Surgical History:  Procedure Laterality Date  . ABDOMINAL HYSTERECTOMY     partial  . BREAST SURGERY Bilateral    implants  . rotator  cuff Right   . 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   Procedure: RIGHT TOTAL KNEE ARTHROPLASTY;  Surgeon: Paralee Cancel, MD;  Location: [facility];  Service: Orthopedics;  Laterality: Right;   Social History   Social History Narrative  . Not on file   Immunization History  Administered Date(s) Administered  . Influenza, High Dose Seasonal PF 01/25/2016, 12/18/2016  . Influenza-Unspecified 11/15/2013, 12/12/2014  . Pneumococcal  Conjugate-13 01/13/2014  . Pneumococcal Polysaccharide-23 04/21/2005  . Tdap 06/25/2006  . Zoster 07/22/2011     Objective: Vital Signs: BP 116/72 (BP Location: Right Arm, Patient Position: Sitting, Cuff Size: Normal)   Pulse 80   Resp 14   Ht 5' 7.75" (1.721 m)   Wt 189 lb (85.7 kg)   BMI 28.95 kg/m    Physical Exam Vitals signs and nursing note reviewed.  Constitutional:      Appearance: She is well-developed.  HENT:     Head: Normocephalic and atraumatic.  Eyes:     Conjunctiva/sclera: Conjunctivae normal.  Neck:     Musculoskeletal: Normal range of motion.  Cardiovascular:     Rate and Rhythm: Normal rate and regular rhythm.     Heart sounds: Normal heart sounds.  Pulmonary:     Effort: Pulmonary effort is normal.     Breath sounds: Normal breath sounds.  Abdominal:     General: Bowel sounds are normal.     Palpations: Abdomen is soft.  Lymphadenopathy:     Cervical: No cervical adenopathy.  Skin:    General: Skin is warm and dry.  Capillary Refill: Capillary refill takes less than 2 seconds.     Comments: Hair thinning was noted.  Mild facial erythema was noted.  Few Telengectesia's were noted on her face.  No skin thickening was noted.  No periungual erythema was noted.  Neurological:     Mental Status: She is alert and oriented to person, place, and time.  Psychiatric:        Behavior: Behavior normal.      Musculoskeletal Exam: C-spine limited range of motion.  Thoracic and lumbar spine limited painful range of motion.  Shoulder joints and elbow joints were in good range of motion.  She has discomfort range of motion of her shoulder joints.   PIP and DIP thickening was noted.  No synovitis was noted.  Hip joints were in good range of motion.  She had right total knee replacement which appears to be doing well.  She had left knee joint thickening and discomfort with range of motion.  No ankle joint swelling was noted.  No MTP swelling was noted.  CDAI Exam:  CDAI Score: Not documented Patient Global Assessment: Not documented; Provider Global Assessment: Not documented Swollen: Not documented; Tender: Not documented Joint Exam   Not documented   There is currently no information documented on the homunculus. Go to the Rheumatology activity and complete the homunculus joint exam.  Investigation: Findings:  03/17/18: ANA+ (no titer), dsDNA 26, RNP-, smith-, Ro-, La-, Sed rate 16, CRP 11, Cryoglobulin-  Component     Latest Ref Rng & Units 03/17/2018  dsDNA Ab     0 - 9 IU/mL 26 (H)  ENA RNP Ab     0.0 - 0.9 AI <0.2  ENA SM Ab Ser-aCnc     0.0 - 0.9 AI <0.2  ENA SSA (RO) Ab     0.0 - 0.9 AI <0.2  ENA SSB (LA) Ab     0.0 - 0.9 AI <0.2  SEE BELOW      Comment  Anti Nuclear Antibody (ANA)     Negative Positive (A)  Sed Rate     0 - 40 mm/hr 16  CRP     0 - 10 mg/L 11 (H)  Cryoglobulin, Ql, Serum, Rflx     None detected Comment   Imaging: Xr Hand 2 View Left  Result Date: 07/19/2018 DIP and PIP narrowing was noted.  First DIP subluxation was noted.  No MCP or CMC joint narrowing was noted.  No intercarpal joint space narrowing was noted. Impression: These findings are consistent with osteoarthritis of the hand.  Xr Hand 2 View Right  Result Date: 07/19/2018 DIP and PIP narrowing was noted.  No MCP or intercarpal changes were noted.  No erosive changes were noted. Impression: These findings are consistent with osteoarthritis of the hand.   Recent Labs: Lab Results  Component Value Date   WBC 7.0 05/07/2017   HGB 12.6 05/07/2017   PLT 317 05/07/2017   NA 136 11/21/2015   K 4.5 11/21/2015   CL 105 11/21/2015   CO2 26 11/21/2015   GLUCOSE 150 (H) 11/21/2015   BUN 18 11/21/2015   CREATININE 1.00 11/30/2017   CALCIUM 8.9 11/21/2015   GFRAA >60 11/21/2015  [facility] labs from July 08, 2018 ANA 1: 2560 centromere, ESR 21, Lyme negative, RF negative  Speciality Comments: No specialty comments available.  Procedures:  No  procedures performed Allergies: Rosuvastatin and Codeine   Assessment / Plan:     Visit Diagnoses: Positive ANA (  antinuclear antibody) - 03/17/18: ANA+ 1:2560 centromere pattern, dsDNA 26, RNP-, smith-, Ro-, La-, Sed rate 16, CRP 11, Cryoglobulin-patient had no obvious Raynauds in the office.  No skin thickening was noted.  She complains of hair thinning.  There is no evidence of oral ulcers, nasal ulcers, lymphadenopathy.  I will obtain following labs today.  Plan: Anti-scleroderma antibody, Beta-2 glycoprotein antibodies, C3 and C4, Cardiolipin antibodies, IgG, IgM, IgA, Lupus Anticoagulant Eval w/Reflex, CK  Raynaud's phenomenon without gangrene-no Raynaud's phenomenon was noted in the office.  She had good capillary refill.  Pain in both hands - Plan: XR Hand 2 View Right, XR Hand 2 View Left.  The x-rays were consistent with osteoarthritis of bilateral hands.  Joint protection muscle strengthening was discussed.  S/P right TKA-patient has chronic pain.  Chronic right shoulder pain-patient had rotator cuff tear repair in the past and has recurrence of symptoms.  She states she had cortisone injection a month ago.  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  Other medical problems are listed as follows:  Restless leg syndrome  Other insomnia  Recurrent major depressive disorder, in full remission ([place])  Diabetes mellitus type 2 without retinopathy ([place])  Gastroesophageal reflux disease without esophagitis  Obstructive sleep apnea treated with continuous positive airway pressure (CPAP)  Essential hypertension   Orders: Orders Placed This Encounter  Procedures  . XR Hand 2 View Right  . XR Hand 2 View Left  . Anti-scleroderma antibody  . Beta-2 glycoprotein antibodies  . C3 and C4  . Cardiolipin antibodies, IgG, IgM, IgA  . Lupus Anticoagulant Eval w/Reflex  . CK    No orders of the defined types were placed in this encounter.   Face-to-face time spent with patient was 50 minutes. Greater than 50% of time was spent in counseling and coordination of care.  Follow-Up Instructions: Return for Positive ANA, osteoarthritis.   Bo Merino, MD  Note - This record has been created using Editor, commissioning.  Chart creation errors have been sought, but may not always  have been located. Such creation errors do not reflect on  the standard of medical care.

## 2018-07-19 ENCOUNTER — Ambulatory Visit (INDEPENDENT_AMBULATORY_CARE_PROVIDER_SITE_OTHER): Payer: Medicare Other

## 2018-07-19 ENCOUNTER — Other Ambulatory Visit: Payer: Self-pay

## 2018-07-19 ENCOUNTER — Encounter: Payer: Self-pay | Admitting: Rheumatology

## 2018-07-19 ENCOUNTER — Ambulatory Visit: Payer: Self-pay

## 2018-07-19 ENCOUNTER — Ambulatory Visit (INDEPENDENT_AMBULATORY_CARE_PROVIDER_SITE_OTHER): Payer: Medicare Other | Admitting: Rheumatology

## 2018-07-19 VITALS — BP 116/72 | HR 80 | Resp 14 | Ht 67.75 in | Wt 189.0 lb

## 2018-07-19 DIAGNOSIS — I7300 Raynaud's syndrome without gangrene: Secondary | ICD-10-CM | POA: Diagnosis not present

## 2018-07-19 DIAGNOSIS — K219 Gastro-esophageal reflux disease without esophagitis: Secondary | ICD-10-CM

## 2018-07-19 DIAGNOSIS — E119 Type 2 diabetes mellitus without complications: Secondary | ICD-10-CM

## 2018-07-19 DIAGNOSIS — M79641 Pain in right hand: Secondary | ICD-10-CM

## 2018-07-19 DIAGNOSIS — G8929 Other chronic pain: Secondary | ICD-10-CM

## 2018-07-19 DIAGNOSIS — Z96651 Presence of right artificial knee joint: Secondary | ICD-10-CM

## 2018-07-19 DIAGNOSIS — M79642 Pain in left hand: Secondary | ICD-10-CM | POA: Diagnosis not present

## 2018-07-19 DIAGNOSIS — M25511 Pain in right shoulder: Secondary | ICD-10-CM

## 2018-07-19 DIAGNOSIS — R768 Other specified abnormal immunological findings in serum: Secondary | ICD-10-CM | POA: Diagnosis not present

## 2018-07-19 DIAGNOSIS — G2581 Restless legs syndrome: Secondary | ICD-10-CM

## 2018-07-19 DIAGNOSIS — F3342 Major depressive disorder, recurrent, in full remission: Secondary | ICD-10-CM

## 2018-07-19 DIAGNOSIS — Z9989 Dependence on other enabling machines and devices: Secondary | ICD-10-CM

## 2018-07-19 DIAGNOSIS — M5124 Other intervertebral disc displacement, thoracic region: Secondary | ICD-10-CM

## 2018-07-19 DIAGNOSIS — I10 Essential (primary) hypertension: Secondary | ICD-10-CM

## 2018-07-19 DIAGNOSIS — M5412 Radiculopathy, cervical region: Secondary | ICD-10-CM

## 2018-07-19 DIAGNOSIS — G4709 Other insomnia: Secondary | ICD-10-CM

## 2018-07-19 DIAGNOSIS — G4733 Obstructive sleep apnea (adult) (pediatric): Secondary | ICD-10-CM

## 2018-07-20 ENCOUNTER — Ambulatory Visit (INDEPENDENT_AMBULATORY_CARE_PROVIDER_SITE_OTHER): Payer: Medicare Other | Admitting: Neurology

## 2018-07-20 ENCOUNTER — Encounter: Payer: Self-pay | Admitting: Neurology

## 2018-07-20 ENCOUNTER — Other Ambulatory Visit: Payer: Self-pay

## 2018-07-20 ENCOUNTER — Telehealth: Payer: Self-pay | Admitting: Neurology

## 2018-07-20 DIAGNOSIS — G8929 Other chronic pain: Secondary | ICD-10-CM

## 2018-07-20 DIAGNOSIS — M5416 Radiculopathy, lumbar region: Secondary | ICD-10-CM

## 2018-07-20 DIAGNOSIS — M25511 Pain in right shoulder: Secondary | ICD-10-CM | POA: Diagnosis not present

## 2018-07-20 DIAGNOSIS — G4733 Obstructive sleep apnea (adult) (pediatric): Secondary | ICD-10-CM

## 2018-07-20 DIAGNOSIS — Z9989 Dependence on other enabling machines and devices: Secondary | ICD-10-CM

## 2018-07-20 DIAGNOSIS — M5136 Other intervertebral disc degeneration, lumbar region: Secondary | ICD-10-CM | POA: Diagnosis not present

## 2018-07-20 MED ORDER — HYDROCODONE-ACETAMINOPHEN 5-325 MG PO TABS: ORAL_TABLET | ORAL | 0 refills | Status: DC

## 2018-07-20 NOTE — Telephone Encounter (Signed)
Medicare/[place] [facility] order sent to [facility]. No auth they will reach out to the pt to schedule.

## 2018-07-20 NOTE — Progress Notes (Signed)
[facility]  PATIENT: Maria Brennan DOB: [birth year]  REFERRING DOCTOR OR PCP:  [name] SOURCE: patient, notes form [name], labwork from [name]  _________________________________   HISTORICAL  CHIEF COMPLAINT:  Chief Complaint  Patient presents with   Sleep Apnea   Neck Pain    HISTORY OF PRESENT ILLNESS:  Maria Brennan is a 73 y.o. woman with insomnia and OSA and spine pain.  Update 07/20/2018: Virtual Visit via Video Note I connected with [name] on 07/20/18 at 11:00 AM EDT by a video enabled telemedicine application and verified that I am speaking with the correct person.  I discussed the limitations of evaluation and management by telemedicine and the availability of in person appointments. The patient expressed understanding and agreed to proceed.  History of Present Illness: She reports her neck pain is doing better than it was at the last visit.   SHe is seeing [employer].   Her shoulder was evaluated and was told she has bad arthritis and may eventually need surgery.   A shoulder injection has helped (bursa injection I think).   She opted not to do cervical ESI (had helped in past).    Her lower back is acting up more.  Pain radiates down the right leg to the knee but not to the foot.  I personally reviewed the MRI of the lumbar spine 09/01/2014.  Of note, at L3-L4 she has severe loss of disc height and a disc protrusion more to the right that causes mild foraminal and minimal lateral recess stenosis though there was no definite nerve root compression at that time.  Additionally at L4-L5 there was a disc protrusion slightly more to the right causing some lateral recess stenosis  Mood is doing ok and she stopped the duloxetine.   She started using CPAP again - now almost every night.  Her insomnia is doing ok.   She feels it improved after stopping duloxetine and using CPAP more.      She saw Dr. [name] (Rheumatology) for  her positive ANA (double-stranded DNA antibody). She has had some episodes of Raynauds.   She also has had tick bites and she received a month of doxycycline.  She had labs and Lyme titer was negative.   Dr. [name] will has ordered additional lab work.  Complement levels are normal and the other labs are pending.  She has NIDDM Type 2 and sugars have been well controlled.    Observations/Objective: She is a well-developed well-nourished woman in no acute distress.  The head is normocephalic and atraumatic.  Sclera are anicteric.  Visible skin appears normal.  The neck has a good range of motion but she feels neck pain and some pain radiating down her spine.  Shoulder range of motion appeared normal.  Pharynx and tongue have normal appearance.  She is alert and fully oriented with fluent speech and good attention, knowledge and memory.  Extraocular muscles are intact.  Facial strength is normal.  Palatal elevation and tongue protrusion are midline.  She appears to have normal strength in the arms.  Rapid alternating movements and finger-nose-finger are performed well.  Assessment and Plan: Chronic right shoulder pain  Other intervertebral disc degeneration, lumbar region - Plan: MR LUMBAR SPINE WO CONTRAST  Right lumbar radiculopathy  Obstructive sleep apnea treated with continuous positive airway pressure (CPAP)   1.   Neck and shoulder pain improved with her recent orthopedic injection.  However, back pain has progressed over the last several  months with pain radiating down the right leg.  Several years ago, she had an epidural steroid injection when she had back and leg pain (radiated down left leg not right at that time) that was beneficial for a long time.  Since her last MRI is 72 years old and symptoms are a little different we need to check another MRI to determine which level is contributing to her symptoms so we can recommend an epidural steroid injection (saw Dr. [name] at [facility] imaging  for cervical ESI in the past).  We will place those orders once the MRI is interpreted. 2.   Renew hydrocodone as needed 3.   Stay active and exercise as tolerated. 4.   Continue CPAP for OSA.   5.   She will return to see me in 4 months or sooner if there are new or worsening neurologic symptoms.  Follow Up Instructions: I discussed the assessment and treatment plan with the patient. The patient was provided an opportunity to ask questions and all were answered. The patient agreed with the plan and demonstrated an understanding of the instructions.    The patient was advised to call back or seek an in-person evaluation if the symptoms worsen or if the condition fails to improve as anticipated.  I provided 25 minutes of non-face-to-face time during this encounter.  _______________________________ From previous visits Update 03/17/2018: She is experiencing more neck pain, radiating down the arms, right > left.   The right C7T1 ESI helped x 3 months.    She notes stiffness in her shoulders and hips, worse in the mornings and evenings   She has reasonably good ROM in shoulders though raising overhead is painful.     She is having spells of her hands turning white, sometimes the right hand and sometimes both hands.    They become very cold.   The spells last 10-15 minutes and then resolve.   She has discomfort but not much pain.      She fell and has a new rotator cuff tear.   She had an injection by ortho with some benefit.    Update 11/12/2017: Her neck pain improved after a cervical spine ESI.   However, her LB is hurting more left >>right.    She is wearing a brace without benefit.    Pain came on suddenly 3 weeks ago.    A heating pad helps slightly. Pain increases with sitting, especially on the left keg and does better on her feet or laying down.       She notes some weakness in the left leg.  An MRI from 2000 shows bulging at [place] and L4L45 and protrusion at L5S1.      Her shoulder pain  improved  She has OSA but is not using it at all now.   For a while she used it intermittently.     Update 10/15/2017: She is reporting vertigo.   At PT they are doing vestibular therapy (sounds like Epley0.    She states it is worse left > right.    She has a strong positional component.     She is having more headaches.   Her neck pain got much better after a cervical ESI.   She did feel weaker for a few hours afterwards but then pain improved quite a bit.     She fell on her right shoulder and has had pain there since mid July due to vertigo.     She had  xrays showing no Fx or much arthritis (report reviewed 09/17/2017) and she was found to have probable osteoporosis  She has OSA and uses CPAP some nights.    She is doing better since sinus surgery.         Update 04/22/2017: She is reporting pain in between the shoulder blades, more to the left.      Moving or turning a certain way worsens the pain.   Pain has been off/on x 1 years but worse the past 2 months.  She has tried a muscle relaxant but it did nit help.  She was unable to tolerate gabapentin.    She has mild kidney issues and is not supposed to take NSAID's.    She has not tried a steroid pack.   She also has mild anemia and is to get a GI endoscopy/colonoscopy.    She is using her CPAP nightly and has good compliance.  She sleeps okay many nights. She still has some restless leg syndrome.   She is to have a deviated septum repair and right sinus operation soon.       Sometimes her fingers turn white and cold and are mildly painful.       I personally reviewed the MRI of the thoracic spine. 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 There is no abnormal signal additionally, there are disc protrusions to the right at T7-T8 into left at T9-T10.   From 11/20/2016: OSA:    On 05/26/2016 PSG she was found to have severe OSA (AHI = 40).   She had her CPAP titration in April 2018 and was titrated to a  pressure of +7 cm.   Initially, she did well with OSA and used it nightly.  She fell asleep more rapidly with it and her snoring resolved. She was slightly less sleepy during the daytime.      However, the last month or two she has not used it much.   When she uses it, efficacy is good with an AHI = 3.6 on the 09/24/16 download (compliance was 97% at that time).   She notes rhinitis on the right.    She has been treated with 2 rounds of antibiotics for sinusitis but still has nasal drainage.     Insomnia:   She felt insomnia did better on CPAP and now she has more insomnia, sleep onset > maintenance.   She is on zolpidem 10 mg and feels it helps.     Taking Ambien 5 mg with hydrocodone and CBD oil helps her fall asleep the best.  She could not tolerate gabapentin.    Hydroxyzine sometimes helps but she can have a hangover if she takes  RLS:   She has RLS when she lays down and if in a car a long time.    He sleep study also showed mild periodic limb movements of sleep. She can't get comfortable and has to move after a couple minutes.  The combination of night time hydrocodone and Ambien has helped.    Other:   She is on hydrocodone nightly for her back pain and it also helps her RLS.       EPWORTH SLEEPINESS SCALE  On a scale of 0 - 3 what is the chance of dozing:  Sitting and Reading:   3 Watching TV:    3 Sitting inactive in a public place: 1 Passenger in car for one hour: 3 Lying down to  rest in the afternoon: 3 Sitting and talking to someone: 0 Sitting quietly after lunch:  3 In a car, stopped in traffic:  1  Total (out of 24):   17/24 (moderate sleepiness -- while not on CPAP)  RESULTS OF PSG  IMPRESSION:  1.  Severe OSA with an AHI = 40.4.   OSA was worse while supine. 2.  Mild periodic limb movements of sleep with little impact on sleep 3.  She had a delayed sleep onset which could be due to a first night effect in the sleep lab.  If persistent at home after correction of the  OSA, consider a sleep aid.     RECOMMENDATIONS:  1. Advise full-night, attended, CPAP titration study to optimize therapy.  If unable to tolerate CPAP, consider positional therapy, an oral appliance or ENT consultation for surgery.   2. Avoid sedative-hypnotics which may worsen sleep apnea, alcohol and tobacco (as applicable). 3. Advise patient to avoid driving or operating hazardous machinery when sleepy. 4. A follow up appointment will be scheduled in the Sleep Clinic at [facility]. The referring provider will be notified of the results.       REVIEW OF SYSTEMS: Constitutional: No fevers, chills, sweats, or change in appetite.   Sleeping ok.   Eyes: No visual changes, double vision, eye pain Ear, nose and throat: No hearing loss, ear pain, nasal congestion, sore throat Cardiovascular: No chest pain, palpitations Respiratory: No shortness of breath at rest or with exertion.   Has OSA GastrointestinaI: No nausea, vomiting, diarrhea, abdominal pain, fecal incontinence Genitourinary: No dysuria, urinary retention or frequency.  No nocturia. Musculoskeletal: HSA mild neck pain and right shulder pain Integumentary: No rash, pruritus, skin lesions Neurological: as above Psychiatric: No depression at this time.  No anxiety Endocrine: No palpitations, diaphoresis, change in appetite, change in weigh or increased thirst Hematologic/Lymphatic: No anemia, purpura, petechiae. Allergic/Immunologic: No itchy/runny eyes, nasal congestion, recent allergic reactions, rashes  ALLERGIES: Allergies  Allergen Reactions   Rosuvastatin Other (See Comments)   Codeine Itching and Nausea Only    HOME MEDICATIONS:  Current Outpatient Medications:    atorvastatin (LIPITOR) 80 MG tablet, Take 80 mg by mouth daily at 6 PM. , Disp: , Rfl:    Biotin 10 MG TABS, Take by mouth., Disp: , Rfl:    Cholecalciferol (VITAMIN D3) 2000 units TABS, Take 2,000 Units by mouth daily.,  Disp: , Rfl:    docusate sodium (COLACE) 100 MG capsule, Take 1 capsule (100 mg total) by mouth 2 (two) times daily., Disp: 10 capsule, Rfl: 0   DULoxetine (CYMBALTA) 30 MG capsule, Take 30 mg by mouth daily. , Disp: , Rfl: 0   GLUCOSAMINE-CHONDROITIN PO, Take 1 tablet by mouth 2 (two) times daily., Disp: , Rfl:    HYDROcodone-acetaminophen (NORCO/VICODIN) 5-325 MG tablet, Take one or two daily, not to exceed 45/month, Disp: 45 tablet, Rfl: 0   liraglutide (VICTOZA) 18 MG/3ML SOPN, Inject into the skin., Disp: , Rfl:    magnesium oxide (MAG-OX) 400 MG tablet, Take 400 mg by mouth daily., Disp: , Rfl:    metFORMIN (GLUCOPHAGE) 500 MG tablet, Take 500 mg by mouth daily., Disp: , Rfl: 0   omeprazole (PRILOSEC) 40 MG capsule, Take 40 mg by mouth every evening. , Disp: , Rfl: 0   triamterene-hydrochlorothiazide (MAXZIDE-25) 37.5-25 MG tablet, Take 0.5 tablets by mouth daily. , Disp: , Rfl:    Turmeric 500 MG TABS, Take by mouth., Disp: , Rfl:  UNABLE TO FIND, Collagen Bone Powder, Disp: , Rfl:    zolpidem (AMBIEN) 5 MG tablet, Take 1 tablet (5 mg total) by mouth at bedtime., Disp: 30 tablet, Rfl: 5  PAST MEDICAL HISTORY: Past Medical History:  Diagnosis Date   Arthritis    oa   Diabetes mellitus without complication (HCC)    Family history of adverse reaction to anesthesia    mother and sister slow to awaken    GERD (gastroesophageal reflux disease)    History of hiatal hernia    Hypertension    MVP (mitral valve prolapse)    PONV (postoperative nausea and vomiting) yrs ago    PAST SURGICAL HISTORY: Past Surgical History:  Procedure Laterality Date   ABDOMINAL HYSTERECTOMY     partial   BREAST SURGERY Bilateral    implants   rotator  cuff Right    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   Procedure: RIGHT TOTAL KNEE ARTHROPLASTY;  Surgeon: Paralee Cancel, MD;  Location: [facility];  Service: Orthopedics;  Laterality: Right;    FAMILY HISTORY: Family History    Problem Relation Age of Onset   Heart disease Mother    Diabetes Mother    Migraines Mother    Diabetes Father    Non-Hodgkin's lymphoma Father    Urticaria Father    Urticaria Sister    Breast cancer Sister    Asthma Brother    Urticaria Brother    Angioedema Brother    Bladder Cancer Brother    Eczema Maternal Grandmother    Allergic rhinitis Daughter    Lupus Daughter    Eczema Daughter    Heart disease Brother     SOCIAL HISTORY:  Social History   Socioeconomic History   Marital status: Married    Spouse name: Not on file   Number of children: Not on file   Years of education: Not on file   Highest education level: Not on file  Occupational History   Not on file  Social Needs   Financial resource strain: Not on file   Food insecurity:    Worry: Not on file    Inability: Not on file   Transportation needs:    Medical: Not on file    Non-medical: Not on file  Tobacco Use   Smoking status: Former Smoker    Packs/day: 0.50    Years: 53.00    Pack years: 26.50    Types: Cigarettes   Smokeless tobacco: Never Used  Substance and Sexual Activity   Alcohol use: No   Drug use: No   Sexual activity: Not on file  Lifestyle   Physical activity:    Days per week: Not on file    Minutes per session: Not on file   Stress: Not on file  Relationships   Social connections:    Talks on phone: Not on file    Gets together: Not on file    Attends religious service: Not on file    Active member of club or organization: Not on file    Attends meetings of clubs or organizations: Not on file    Relationship status: Not on file   Intimate partner violence:    Fear of current or ex partner: Not on file    Emotionally abused: Not on file    Physically abused: Not on file    Forced sexual activity: Not on file  Other Topics Concern   Not on file  Social History Narrative   Not on file  PHYSICAL EXAM  There were no vitals  filed for this visit.  There is no height or weight on file to calculate BMI.   General: The patient is well-developed and well-nourished and in no acute distress  HEENT:  [place]/AT.   She has mid to lowert cervical tenderness  Musculoskeletal:   Some piriformis tenderness.   Reduced shoulder ROM   Neurologic Exam  Mental status: The patient is alert and oriented x 3 at the time of the examination. The patient has apparent normal recent and remote memory, with an apparently normal attention span and concentration ability.   Speech is normal.  Cranial nerves: Extraocular movements are full.  Normal facial strength.  Normal trapezius strength.    The tongue is midline, and the patient has symmetric elevation of the soft palate.  She has mild reduced left hearing.  Motor:  Muscle bulk is normal.   Muscle tone is norma;.   Strength is 5/5  Sensory: Intact sensation to touch and vibration in the arms and legs  Coordination: Cerebellar testing shows good Finger nose finger and heel-to-shin bilaterally.  Gait and station: Station is normal.   Gait is normal.   Tandem slightly wide  Romberg is negative.   Reflexes: Deep tendon reflexes are symmetric and normal bilaterally.         DIAGNOSTIC DATA (LABS, IMAGING, TESTING) - I reviewed patient records, labs, notes, testing and imaging myself where available.  Lab Results  Component Value Date   WBC 7.0 05/07/2017   HGB 12.6 05/07/2017   HCT 37.7 05/07/2017   MCV 92 05/07/2017   PLT 317 05/07/2017      Component Value Date/Time   NA 136 11/21/2015 0429   K 4.5 11/21/2015 0429   CL 105 11/21/2015 0429   CO2 26 11/21/2015 0429   GLUCOSE 150 (H) 11/21/2015 0429   BUN 18 11/21/2015 0429   CREATININE 1.00 11/30/2017 1912   CALCIUM 8.9 11/21/2015 0429   GFRNONAA >60 11/21/2015 0429   GFRAA >60 11/21/2015 0429   No results found for: CHOL, HDL, LDLCALC, LDLDIRECT, TRIG, CHOLHDL Lab Results  Component Value Date   HGBA1C 6.0 (H)  11/09/2015       ASSESSMENT AND PLAN  Chronic right shoulder pain  Other intervertebral disc degeneration, lumbar region - Plan: MR LUMBAR SPINE WO CONTRAST  Right lumbar radiculopathy  Obstructive sleep apnea treated with continuous positive airway pressure (CPAP)      Vashon Arch A. Felecia Shelling, MD, PhD, FAAN Certified in Neurology, Clinical Neurophysiology, Sleep Medicine, Pain Medicine and Neuroimaging Director, [place] [facility] [address] [place],  [address] [phone]

## 2018-07-22 LAB — BETA-2 GLYCOPROTEIN ANTIBODIES
Beta-2 Glyco 1 IgA: <9 SAU (ref <=20)
Beta-2 Glyco 1 IgM: <9 SMU (ref <=20)
Beta-2 Glyco I IgG: <9 SGU (ref <=20)

## 2018-07-22 LAB — LUPUS ANTICOAGULANT EVAL W/ REFLEX
PTT-LA Screen: 31 s (ref <=40)
dRVVT: 32 s (ref <=45)

## 2018-07-22 LAB — C3 AND C4
C3 Complement: 158 mg/dL (ref 83–193)
C4 Complement: 36 mg/dL (ref 15–57)

## 2018-07-22 LAB — CARDIOLIPIN ANTIBODIES, IGG, IGM, IGA
Anticardiolipin IgA: <11 [APL'U]
Anticardiolipin IgG: <14 [GPL'U]
Anticardiolipin IgM: <12 [MPL'U]

## 2018-07-22 LAB — ANTI-SCLERODERMA ANTIBODY: Scleroderma (Scl-70) (ENA) Antibody, IgG: <1 AI

## 2018-07-30 ENCOUNTER — Encounter: Payer: Self-pay | Admitting: Rheumatology

## 2018-07-30 ENCOUNTER — Other Ambulatory Visit: Payer: Self-pay

## 2018-07-30 ENCOUNTER — Ambulatory Visit (INDEPENDENT_AMBULATORY_CARE_PROVIDER_SITE_OTHER): Payer: Medicare Other | Admitting: Rheumatology

## 2018-07-30 VITALS — BP 120/67 | HR 89 | Resp 16 | Ht 67.75 in | Wt 191.2 lb

## 2018-07-30 DIAGNOSIS — M75101 Unspecified rotator cuff tear or rupture of right shoulder, not specified as traumatic: Secondary | ICD-10-CM

## 2018-07-30 DIAGNOSIS — Z96651 Presence of right artificial knee joint: Secondary | ICD-10-CM | POA: Diagnosis not present

## 2018-07-30 DIAGNOSIS — R768 Other specified abnormal immunological findings in serum: Secondary | ICD-10-CM | POA: Diagnosis not present

## 2018-07-30 DIAGNOSIS — M5030 Other cervical disc degeneration, unspecified cervical region: Secondary | ICD-10-CM

## 2018-07-30 DIAGNOSIS — M19041 Primary osteoarthritis, right hand: Secondary | ICD-10-CM | POA: Diagnosis not present

## 2018-07-30 DIAGNOSIS — M19042 Primary osteoarthritis, left hand: Secondary | ICD-10-CM

## 2018-07-30 DIAGNOSIS — I7300 Raynaud's syndrome without gangrene: Secondary | ICD-10-CM

## 2018-07-30 DIAGNOSIS — E119 Type 2 diabetes mellitus without complications: Secondary | ICD-10-CM

## 2018-07-30 DIAGNOSIS — Z9989 Dependence on other enabling machines and devices: Secondary | ICD-10-CM

## 2018-07-30 DIAGNOSIS — K219 Gastro-esophageal reflux disease without esophagitis: Secondary | ICD-10-CM

## 2018-07-30 DIAGNOSIS — G4733 Obstructive sleep apnea (adult) (pediatric): Secondary | ICD-10-CM

## 2018-07-30 DIAGNOSIS — I10 Essential (primary) hypertension: Secondary | ICD-10-CM

## 2018-07-30 DIAGNOSIS — M12811 Other specific arthropathies, not elsewhere classified, right shoulder: Secondary | ICD-10-CM

## 2018-07-30 DIAGNOSIS — M5134 Other intervertebral disc degeneration, thoracic region: Secondary | ICD-10-CM

## 2018-07-30 DIAGNOSIS — G2581 Restless legs syndrome: Secondary | ICD-10-CM

## 2018-07-30 DIAGNOSIS — F3342 Major depressive disorder, recurrent, in full remission: Secondary | ICD-10-CM

## 2018-07-30 NOTE — Progress Notes (Signed)
Office Visit Note  Patient: Maria Brennan             Date of Birth: 01-19-1946           MRN: [id number]             PCP: Kristopher Glee., MD Referring: Kristopher Glee., MD Visit Date: 07/30/2018 Occupation: @GUAROCC @  Subjective:  Fatigue and joint pain.   History of Present Illness: Maria Brennan is a 73 y.o. female with history of osteoarthritis and positive ANA.  She states that she continues to have pain and discomfort in multiple joints.  She denies any joint swelling.  She has not had any recent problems with Raynaud's phenomenon.  She states she has had history of canker sores for a long time.  She has had oral or nasal ulcers and frequently.  She states she had recent ulcer on her lip which resolved.  She is concerned about her hair thinning.  Activities of Daily Living:  Patient reports morning stiffness for 30 minutes.   Patient Reports nocturnal pain.  Difficulty dressing/grooming: Denies Difficulty climbing stairs: Reports Difficulty getting out of chair: Reports Difficulty using hands for taps, buttons, cutlery, and/or writing: Reports  Review of Systems  Constitutional: Positive for fatigue. Negative for night sweats, weight gain and weight loss.  HENT: Positive for mouth sores and mouth dryness. Negative for trouble swallowing, trouble swallowing and nose dryness.        Related to CPAP and meds +nasal ulcers  Eyes: Negative for pain, redness, visual disturbance and dryness.  Respiratory: Negative for cough, shortness of breath and difficulty breathing.   Cardiovascular: Negative for chest pain, palpitations, hypertension, irregular heartbeat and swelling in legs/feet.  Gastrointestinal: Negative for blood in stool, constipation and diarrhea.  Endocrine: Positive for excessive thirst. Negative for increased urination.  Genitourinary: Negative for difficulty urinating and vaginal dryness.  Musculoskeletal: Positive for arthralgias, joint pain, joint swelling,  morning stiffness and muscle tenderness. Negative for gait problem, myalgias, muscle weakness and myalgias.  Skin: Positive for color change, hair loss and sensitivity to sunlight. Negative for rash, skin tightness and ulcers.  Allergic/Immunologic: Negative for susceptible to infections.  Neurological: Negative for dizziness, memory loss, night sweats and weakness.  Hematological: Negative for bruising/bleeding tendency and swollen glands.  Psychiatric/Behavioral: Positive for sleep disturbance. Negative for depressed mood. The patient is nervous/anxious.     PMFS History:  Patient Active Problem List   Diagnosis Date Noted   Primary osteoarthritis of both hands 07/30/2018   DDD (degenerative disc disease), cervical 07/30/2018   DDD (degenerative disc disease), thoracic 07/30/2018   Right lumbar radiculopathy 07/20/2018   Raynaud phenomenon 03/17/2018   Left sided sciatica 11/12/2017   Cervical radiculopathy 10/15/2017   Shoulder pain, right 10/15/2017   Immunodeficiency, unspecified ([place]) 04/29/2017   Other allergic rhinitis 04/28/2017   Essential hypertension 04/28/2017   Gastroesophageal reflux disease without esophagitis 04/28/2017   Upper back pain 04/22/2017   Protrusion of intervertebral disc of thoracic region 04/22/2017   Other chronic sinusitis 11/20/2016   Periodic limb movement 06/18/2016   Obstructive sleep apnea treated with continuous positive airway pressure (CPAP) 05/27/2016   Restless leg syndrome 05/12/2016   Snoring 05/12/2016   Excessive daytime sleepiness 05/12/2016   Overweight (BMI 25.0-29.9) 11/21/2015   S/P right TKA 11/20/2015   Recurrent major depressive disorder, in full remission ([place]) 04/02/2015   Insomnia 03/13/2015   Diabetes mellitus type 2 without retinopathy ([place]) 03/13/2015   Major depression 03/13/2015  Past Medical History:  Diagnosis Date   Arthritis    oa   Diabetes mellitus without complication (HCC)      Family history of adverse reaction to anesthesia    mother and sister slow to awaken    GERD (gastroesophageal reflux disease)    History of hiatal hernia    Hypertension    MVP (mitral valve prolapse)    PONV (postoperative nausea and vomiting) yrs ago    Family History  Problem Relation Age of Onset   Heart disease Mother    Diabetes Mother    Migraines Mother    Diabetes Father    Non-Hodgkin's lymphoma Father    Urticaria Father    Urticaria Sister    Breast cancer Sister    Asthma Brother    Urticaria Brother    Angioedema Brother    Bladder Cancer Brother    Eczema Maternal Grandmother    Allergic rhinitis Daughter    Lupus Daughter    Eczema Daughter    Heart disease Brother    Past Surgical History:  Procedure Laterality Date   ABDOMINAL HYSTERECTOMY     partial   BREAST SURGERY Bilateral    implants   rotator  cuff Right    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   Procedure: RIGHT TOTAL KNEE ARTHROPLASTY;  Surgeon: Paralee Cancel, MD;  Location: [facility];  Service: Orthopedics;  Laterality: Right;   TOTAL SHOULDER ARTHROPLASTY     Social History   Social History Narrative   Not on file   Immunization History  Administered Date(s) Administered   Influenza, High Dose Seasonal PF 01/25/2016, 12/18/2016   Influenza-Unspecified 11/15/2013, 12/12/2014   Pneumococcal Conjugate-13 01/13/2014   Pneumococcal Polysaccharide-23 04/21/2005   Tdap 06/25/2006   Zoster 07/22/2011     Objective: Vital Signs: BP 120/67 (BP Location: Left Arm, Patient Position: Sitting, Cuff Size: Normal)    Pulse 89    Resp 16    Ht 5' 7.75" (1.721 m)    Wt 191 lb 3.2 oz (86.7 kg)    BMI 29.29 kg/m    Physical Exam Vitals signs and nursing note reviewed.  Constitutional:      Appearance: She is well-developed.  HENT:     Head: Normocephalic and atraumatic.  Eyes:     Conjunctiva/sclera: Conjunctivae normal.  Neck:     Musculoskeletal: Normal  range of motion.  Cardiovascular:     Rate and Rhythm: Normal rate and regular rhythm.     Heart sounds: Normal heart sounds.  Pulmonary:     Effort: Pulmonary effort is normal.     Breath sounds: Normal breath sounds.  Abdominal:     General: Bowel sounds are normal.     Palpations: Abdomen is soft.  Lymphadenopathy:     Cervical: No cervical adenopathy.  Skin:    General: Skin is warm and dry.     Capillary Refill: Capillary refill takes less than 2 seconds.     Comments: Hair thining noted.  Neurological:     Mental Status: She is alert and oriented to person, place, and time.  Psychiatric:        Behavior: Behavior normal.      Musculoskeletal Exam: C-spine and thoracic limited range of motion.  She has good range of motion in her shoulder joints elbow joints wrist joint MCPs PIPs DIPs with DIP and PIP thickening with no synovitis.  Right total knee replacement is doing well.  Left knee joint was in good range of  motion.  She has no warmth or swelling over her ankle joints or MTPs.  CDAI Exam: CDAI Score: Not documented Patient Global Assessment: Not documented; Provider Global Assessment: Not documented Swollen: Not documented; Tender: Not documented Joint Exam   Not documented   There is currently no information documented on the homunculus. Go to the Rheumatology activity and complete the homunculus joint exam.  Investigation: No additional findings.  Imaging: Xr Hand 2 View Left  Result Date: 07/19/2018 DIP and PIP narrowing was noted.  First DIP subluxation was noted.  No MCP or CMC joint narrowing was noted.  No intercarpal joint space narrowing was noted. Impression: These findings are consistent with osteoarthritis of the hand.  Xr Hand 2 View Right  Result Date: 07/19/2018 DIP and PIP narrowing was noted.  No MCP or intercarpal changes were noted.  No erosive changes were noted. Impression: These findings are consistent with osteoarthritis of the hand.          Lab Results  Component Value Date   WBC 7.0 05/07/2017   HGB 12.6 05/07/2017   PLT 317 05/07/2017   NA 136 11/21/2015   K 4.5 11/21/2015   CL 105 11/21/2015   CO2 26 11/21/2015   GLUCOSE 150 (H) 11/21/2015   BUN 18 11/21/2015   CREATININE 1.00 11/30/2017   CALCIUM 8.9 11/21/2015   GFRAA >60 11/21/2015    [facility] [place] labs from July 08, 2018 ANA 1: 2560 centromere, ESR 21, Lyme negative, RF negative   Recent Labs: Lab Results  Component Value Date   WBC 7.0 05/07/2017   HGB 12.6 05/07/2017   PLT 317 05/07/2017   NA 136 11/21/2015   K 4.5 11/21/2015   CL 105 11/21/2015   CO2 26 11/21/2015   GLUCOSE 150 (H) 11/21/2015   BUN 18 11/21/2015   CREATININE 1.00 11/30/2017   CALCIUM 8.9 11/21/2015   GFRAA >60 11/21/2015  Jul 19, 2018 beta-2 negative, anticardiolipin negative, lupus anticoagulant negative, SCL 70-, C3-C4 normal  03/17/18: ANA+ (no titer), dsDNA 26, RNP-, smith-, Ro-, La-, Sed rate 16, CRP 11, Cryoglobulin-    Speciality Comments: No specialty comments available.  Procedures:  No procedures performed Allergies: Rosuvastatin and Codeine   Assessment / Plan:     Visit Diagnoses: Positive ANA (antinuclear antibody) -patient has positive ANA and positive double-stranded DNA.  She also gives history of fatigue hair loss, raynauds phenomenon and arthralgias.  No synovitis was noted.  She complains of raynauds phenomenon.  This was not witnessed in the office.  She complains of hair thinning.  No skin changes or sclerodactyly was noted.  At this point I do not feel that the use of Plaquenil will be justified.  Her complements and sed rate were normal.  Although I have advised patient to contact me which case she develops new symptoms.  I would like to repeat her labs in 4 months.  ANA 1: 2560 centromere, dsDNA positive, SCL 70- - Plan: CBC with Differential/Platelet, COMPLETE METABOLIC PANEL WITH GFR, Urinalysis, Routine w reflex microscopic, CK, ANA,  Anti-DNA antibody, double-stranded, C3 and C4, Glucose 6 phosphate dehydrogenase  Raynaud's disease without gangrene-he had good capillary refill.  No digital cyanosis was noted.  Primary osteoarthritis of both hands-clinical findings are consistent with osteoarthritis.  S/P right TKA-doing well.  Rotator cuff tear arthropathy, right - Status post repair  DDD (degenerative disc disease), cervical-chronic pain  DDD (degenerative disc disease), thoracic-chronic pain.  Essential hypertension  Diabetes mellitus type 2 without retinopathy ([place])  Recurrent major depressive disorder, in full remission (HCC)  Restless leg syndrome  Obstructive sleep apnea treated with continuous positive airway pressure (CPAP)  Gastroesophageal reflux disease without esophagitis   Orders: Orders Placed This Encounter  Procedures   CBC with Differential/Platelet   COMPLETE METABOLIC PANEL WITH GFR   Urinalysis, Routine w reflex microscopic   CK   ANA   Anti-DNA antibody, double-stranded   C3 and C4   Glucose 6 phosphate dehydrogenase   No orders of the defined types were placed in this encounter.  .  Follow-Up Instructions: Return in about 4 months (around 11/30/2018) for +ANA.   Bo Merino, MD  Note - This record has been created using Editor, commissioning.  Chart creation errors have been sought, but may not always  have been located. Such creation errors do not reflect on  the standard of medical care.

## 2018-08-09 ENCOUNTER — Other Ambulatory Visit: Payer: Self-pay

## 2018-08-09 ENCOUNTER — Ambulatory Visit
Admission: RE | Admit: 2018-08-09 | Discharge: 2018-08-09 | Disposition: A | Discharge status: Home / Self Care | Payer: Medicare Other | Source: Ambulatory Visit | Attending: Neurology | Admitting: Neurology

## 2018-08-09 DIAGNOSIS — M5136 Other intervertebral disc degeneration, lumbar region: Secondary | ICD-10-CM

## 2018-08-12 ENCOUNTER — Ambulatory Visit: Payer: Medicare Other | Admitting: Rheumatology

## 2018-08-12 ENCOUNTER — Telehealth: Payer: Self-pay | Admitting: *Deleted

## 2018-08-12 DIAGNOSIS — M5136 Other intervertebral disc degeneration, lumbar region: Secondary | ICD-10-CM

## 2018-08-12 NOTE — Telephone Encounter (Signed)
Called pt. Relayed results per Dr. Felecia Shelling. Placed ESI order in [place].   She asked if we received CPAP order. Advised we did and faxed back signed on 08/10/18

## 2018-08-12 NOTE — Telephone Encounter (Signed)
-----   Message from Britt Bottom, MD sent at 08/12/2018  4:52 PM EDT ----- Please let her know that the MRI of the lumbar spine showed multilevel degenerative changes.  The degeneration at L4-L5 is worse than it was in 2016 to the right degenerative changes are worse towards the left L2-L3.  If her pain is not any better (she has right-sided pain) then set up an epidural steroid injection at L4-L5

## 2018-08-17 ENCOUNTER — Ambulatory Visit: Payer: Self-pay | Admitting: Rheumatology

## 2018-08-17 ENCOUNTER — Other Ambulatory Visit: Payer: Self-pay | Admitting: Neurology

## 2018-08-17 MED ORDER — HYDROCODONE-ACETAMINOPHEN 5-325 MG PO TABS: ORAL_TABLET | ORAL | 0 refills | Status: DC

## 2018-08-17 NOTE — Telephone Encounter (Signed)
Pt is needing a refill on her HYDROcodone-acetaminophen (NORCO/VICODIN) 5-325 MG tablet sent to [facility] on [address]. In [place]

## 2018-08-25 ENCOUNTER — Other Ambulatory Visit: Payer: Self-pay

## 2018-08-25 ENCOUNTER — Ambulatory Visit
Admission: RE | Admit: 2018-08-25 | Discharge: 2018-08-25 | Disposition: A | Discharge status: Home / Self Care | Payer: Medicare Other | Source: Ambulatory Visit | Attending: Neurology | Admitting: Neurology

## 2018-08-25 DIAGNOSIS — M5136 Other intervertebral disc degeneration, lumbar region: Secondary | ICD-10-CM

## 2018-08-25 MED ORDER — METHYLPREDNISOLONE ACETATE 40 MG/ML INJ SUSP (RADIOLOG
120.0000 mg | Freq: Once | INTRAMUSCULAR | Status: AC
  Administered 2018-08-25: 120 mg via EPIDURAL

## 2018-08-25 MED ORDER — IOPAMIDOL (ISOVUE-M 200) INJECTION 41%
1.0000 mL | Freq: Once | INTRAMUSCULAR | Status: AC
  Administered 2018-08-25: 13:00:00 1 mL via EPIDURAL

## 2018-09-20 ENCOUNTER — Other Ambulatory Visit: Payer: Self-pay | Admitting: Neurology

## 2018-09-20 MED ORDER — HYDROCODONE-ACETAMINOPHEN 5-325 MG PO TABS: ORAL_TABLET | ORAL | 0 refills | Status: DC

## 2018-09-20 NOTE — Telephone Encounter (Signed)
Patient needs a refill on her hydrocodone  

## 2018-09-20 NOTE — Addendum Note (Signed)
Addended by: Rossie Muskrat L on: 09/20/2018 11:09 AM   Modules accepted: Orders

## 2018-10-18 ENCOUNTER — Other Ambulatory Visit: Payer: Self-pay | Admitting: Neurology

## 2018-10-18 MED ORDER — HYDROCODONE-ACETAMINOPHEN 5-325 MG PO TABS: ORAL_TABLET | ORAL | 0 refills | Status: DC

## 2018-10-18 NOTE — Telephone Encounter (Signed)
Checked drug registry. She last refilled 09/20/18 #45. Post-dated to fill on or after 10/20/18. Last seen 07/20/18 and next f/u 11/25/18

## 2018-10-18 NOTE — Telephone Encounter (Signed)
Pt is needing a refill on her HYDROcodone-acetaminophen (NORCO/VICODIN) 5-325 MG tablet sent to the [place], [place] - [address]

## 2018-11-17 ENCOUNTER — Other Ambulatory Visit: Payer: Self-pay | Admitting: Neurology

## 2018-11-17 MED ORDER — HYDROCODONE-ACETAMINOPHEN 5-325 MG PO TABS: ORAL_TABLET | ORAL | 0 refills | Status: AC

## 2018-11-17 NOTE — Addendum Note (Signed)
Addended by: Rossie Muskrat L on: 11/17/2018 12:17 PM   Modules accepted: Orders

## 2018-11-17 NOTE — Telephone Encounter (Signed)
Pt has called for a refill on her HYDROcodone-acetaminophen (NORCO/VICODIN) 5-325 MG tablet [facility] [id number]

## 2018-11-17 NOTE — Progress Notes (Deleted)
Office Visit Note  Patient: Maria Brennan             Date of Birth: 07-25-1945           MRN: [id number]             PCP: Kristopher Glee., MD Referring: Kristopher Glee., MD Visit Date: 12/01/2018 Occupation: @GUAROCC @  Subjective:  No chief complaint on file.   History of Present Illness: Maria Brennan is a 73 y.o. female ***   Activities of Daily Living:  Patient reports morning stiffness for *** {minute/hour:19697}.   Patient {ACTIONS;DENIES/REPORTS:21021675::"Denies"} nocturnal pain.  Difficulty dressing/grooming: {ACTIONS;DENIES/REPORTS:21021675::"Denies"} Difficulty climbing stairs: {ACTIONS;DENIES/REPORTS:21021675::"Denies"} Difficulty getting out of chair: {ACTIONS;DENIES/REPORTS:21021675::"Denies"} Difficulty using hands for taps, buttons, cutlery, and/or writing: {ACTIONS;DENIES/REPORTS:21021675::"Denies"}  No Rheumatology ROS completed.   PMFS History:  Patient Active Problem List   Diagnosis Date Noted  . Primary osteoarthritis of both hands 07/30/2018  . DDD (degenerative disc disease), cervical 07/30/2018  . DDD (degenerative disc disease), thoracic 07/30/2018  . Right lumbar radiculopathy 07/20/2018  . Raynaud phenomenon 03/17/2018  . Left sided sciatica 11/12/2017  . Cervical radiculopathy 10/15/2017  . Shoulder pain, right 10/15/2017  . Immunodeficiency, unspecified ([place]) 04/29/2017  . Other allergic rhinitis 04/28/2017  . Essential hypertension 04/28/2017  . Gastroesophageal reflux disease without esophagitis 04/28/2017  . Upper back pain 04/22/2017  . 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 Other chronic sinusitis 11/20/2016  . Periodic limb movement 06/18/2016  . Obstructive sleep apnea treated with continuous positive airway pressure (CPAP) 05/27/2016  . Restless leg syndrome 05/12/2016  . Snoring 05/12/2016  . Excessive daytime sleepiness 05/12/2016  . Overweight (BMI 25.0-29.9) 11/21/2015  . S/P right TKA 11/20/2015   . Recurrent major depressive disorder, in full remission ([place]) 04/02/2015  . Insomnia 03/13/2015  . Diabetes mellitus type 2 without retinopathy ([place]) 03/13/2015  . Major depression 03/13/2015    Past Medical History:  Diagnosis Date  . Arthritis    oa  . Diabetes mellitus without complication ([place])   . Family history of adverse reaction to anesthesia    mother and sister slow to awaken   . GERD (gastroesophageal reflux disease)   . History of hiatal hernia   . Hypertension   . MVP (mitral valve prolapse)   . PONV (postoperative nausea and vomiting) yrs ago    Family History  Problem Relation Age of Onset  . Heart disease Mother   . Diabetes Mother   . Migraines Mother   . Diabetes Father   . Non-Hodgkin's lymphoma Father   . Urticaria Father   . Urticaria Sister   . Breast cancer Sister   . Asthma Brother   . Urticaria Brother   . Angioedema Brother   . Bladder Cancer Brother   . Eczema Maternal Grandmother   . Allergic rhinitis Daughter   . Lupus Daughter   . Eczema Daughter   . Heart disease Brother    Past Surgical History:  Procedure Laterality Date  . ABDOMINAL HYSTERECTOMY     partial  . BREAST SURGERY Bilateral    implants  . rotator  cuff Right   . 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 TOTAL SHOULDER ARTHROPLASTY     Social History   Social History Narrative  . Not on file   Immunization History  Administered Date(s) Administered  . Influenza,  High Dose Seasonal PF 01/25/2016, 12/18/2016  . Influenza-Unspecified 11/15/2013, 12/12/2014  . Pneumococcal Conjugate-13 01/13/2014  . Pneumococcal Polysaccharide-23 04/21/2005  . Tdap 06/25/2006  . Zoster 07/22/2011     Objective: Vital Signs: There were no vitals taken for this visit.   Physical Exam   Musculoskeletal Exam: ***  CDAI Exam: CDAI Score: -  Patient Global: -; Provider Global: - Swollen: -; Tender: - Joint Exam   No joint exam has been documented for this visit   There is currently no information documented on the homunculus. Go to the Rheumatology activity and complete the homunculus joint exam.  Investigation: No additional findings.  Imaging: No results found.  Recent Labs: Lab Results  Component Value Date   WBC 7.0 05/07/2017   HGB 12.6 05/07/2017   PLT 317 05/07/2017   NA 136 11/21/2015   K 4.5 11/21/2015   CL 105 11/21/2015   CO2 26 11/21/2015   GLUCOSE 150 (H) 11/21/2015   BUN 18 11/21/2015   CREATININE 1.00 11/30/2017   CALCIUM 8.9 11/21/2015   GFRAA >60 11/21/2015    Speciality Comments: No specialty comments available.  Procedures:  No procedures performed Allergies: Rosuvastatin and Codeine   Assessment / Plan:     Visit Diagnoses: No diagnosis found.  Orders: No orders of the defined types were placed in this encounter.  No orders of the defined types were placed in this encounter.   Face-to-face time spent with patient was *** minutes. Greater than 50% of time was spent in counseling and coordination of care.  Follow-Up Instructions: No follow-ups on file.   Earnestine Mealing, CMA  Note - This record has been created using Editor, commissioning.  Chart creation errors have been sought, but may not always  have been located. Such creation errors do not reflect on  the standard of medical care.

## 2018-11-17 NOTE — Telephone Encounter (Signed)
Checked drug registry. She last refilled 10/20/18 #45. Last seen 07/20/18 and next f/u 11/25/18. Not receiving from other MD's.

## 2018-11-25 ENCOUNTER — Ambulatory Visit: Payer: Medicare Other | Admitting: Neurology

## 2018-11-29 ENCOUNTER — Encounter: Payer: Self-pay | Admitting: Neurology

## 2018-12-01 ENCOUNTER — Ambulatory Visit: Payer: Medicare Other | Admitting: Rheumatology

## 2019-07-07 ENCOUNTER — Institutional Professional Consult (permissible substitution): Payer: Medicare Other | Admitting: Plastic Surgery

## 2019-07-27 ENCOUNTER — Telehealth: Payer: Self-pay | Admitting: Neurology

## 2019-07-27 NOTE — Telephone Encounter (Signed)
Called and spoke with husband, Richard. Pt was not home at time of call. He is aware she will need appt prior to being able to provide orders since it has been over a yr since she has been seen. She will have her call back in the morning. Advised she can schedule with AL,NP. She has some openings next week

## 2019-07-27 NOTE — Telephone Encounter (Signed)
I reviewed patient's chart and she was last seen 07/20/2018. She will need an updated office visit for [place] to be able to send updated orders.

## 2019-07-27 NOTE — Telephone Encounter (Signed)
PT called to report she is needing to order cpap supplies but does not remember the company her supplies were sent to. [phone] [id number]

## 2019-08-02 ENCOUNTER — Institutional Professional Consult (permissible substitution): Payer: Medicare Other | Admitting: Plastic Surgery

## 2019-08-15 ENCOUNTER — Encounter: Payer: Self-pay | Admitting: Family Medicine

## 2019-08-15 ENCOUNTER — Ambulatory Visit (INDEPENDENT_AMBULATORY_CARE_PROVIDER_SITE_OTHER): Payer: Medicare Other | Admitting: Family Medicine

## 2019-08-15 ENCOUNTER — Other Ambulatory Visit: Payer: Self-pay

## 2019-08-15 VITALS — BP 108/55 | HR 83 | Ht 68.0 in | Wt 186.0 lb

## 2019-08-15 DIAGNOSIS — Z9989 Dependence on other enabling machines and devices: Secondary | ICD-10-CM | POA: Diagnosis not present

## 2019-08-15 DIAGNOSIS — G4733 Obstructive sleep apnea (adult) (pediatric): Secondary | ICD-10-CM

## 2019-08-15 NOTE — Patient Instructions (Signed)
Please continue using your CPAP regularly. While your insurance requires that you use CPAP at least 4 hours each night on 70% of the nights, I recommend, that you not skip any nights and use it throughout the night if you can. Getting used to CPAP and staying with the treatment long term does take time and patience and discipline. Untreated obstructive sleep apnea when it is moderate to severe can have an adverse impact on cardiovascular health and raise her risk for heart disease, arrhythmias, hypertension, congestive heart failure, stroke and diabetes. Untreated obstructive sleep apnea causes sleep disruption, nonrestorative sleep, and sleep deprivation. This can have an impact on your day to day functioning and cause daytime sleepiness and impairment of cognitive function, memory loss, mood disturbance, and problems focussing. Using CPAP regularly can improve these symptoms.   Follow up in 3 months   Sleep Apnea Sleep apnea affects breathing during sleep. It causes breathing to stop for a short time or to become shallow. It can also increase the risk of:  Heart attack.  Stroke.  Being very overweight (obese).  Diabetes.  Heart failure.  Irregular heartbeat. The goal of treatment is to help you breathe normally again. What are the causes? There are three kinds of sleep apnea:  Obstructive sleep apnea. This is caused by a blocked or collapsed airway.  Central sleep apnea. This happens when the brain does not send the right signals to the muscles that control breathing.  Mixed sleep apnea. This is a combination of obstructive and central sleep apnea. The most common cause of this condition is a collapsed or blocked airway. This can happen if:  Your throat muscles are too relaxed.  Your tongue and tonsils are too large.  You are overweight.  Your airway is too small. What increases the risk?  Being overweight.  Smoking.  Having a small airway.  Being older.  Being  female.  Drinking alcohol.  Taking medicines to calm yourself (sedatives or tranquilizers).  Having family members with the condition. What are the signs or symptoms?  Trouble staying asleep.  Being sleepy or tired during the day.  Getting angry a lot.  Loud snoring.  Headaches in the morning.  Not being able to focus your mind (concentrate).  Forgetting things.  Less interest in sex.  Mood swings.  Personality changes.  Feelings of sadness (depression).  Waking up a lot during the night to pee (urinate).  Dry mouth.  Sore throat. How is this diagnosed?  Your medical history.  A physical exam.  A test that is done when you are sleeping (sleep study). The test is most often done in a sleep lab but may also be done at home. How is this treated?   Sleeping on your side.  Using a medicine to get rid of mucus in your nose (decongestant).  Avoiding the use of alcohol, medicines to help you relax, or certain pain medicines (narcotics).  Losing weight, if needed.  Changing your diet.  Not smoking.  Using a machine to open your airway while you sleep, such as: ? An oral appliance. This is a mouthpiece that shifts your lower jaw forward. ? A CPAP device. This device blows air through a mask when you breathe out (exhale). ? An EPAP device. This has valves that you put in each nostril. ? A BPAP device. This device blows air through a mask when you breathe in (inhale) and breathe out.  Having surgery if other treatments do not work. It is   important to get treatment for sleep apnea. Without treatment, it can lead to:  High blood pressure.  Coronary artery disease.  In men, not being able to have an erection (impotence).  Reduced thinking ability. Follow these instructions at home: Lifestyle  Make changes that your doctor recommends.  Eat a healthy diet.  Lose weight if needed.  Avoid alcohol, medicines to help you relax, and some pain  medicines.  Do not use any products that contain nicotine or tobacco, such as cigarettes, e-cigarettes, and chewing tobacco. If you need help quitting, ask your doctor. General instructions  Take over-the-counter and prescription medicines only as told by your doctor.  If you were given a machine to use while you sleep, use it only as told by your doctor.  If you are having surgery, make sure to tell your doctor you have sleep apnea. You may need to bring your device with you.  Keep all follow-up visits as told by your doctor. This is important. Contact a doctor if:  The machine that you were given to use during sleep bothers you or does not seem to be working.  You do not get better.  You get worse. Get help right away if:  Your chest hurts.  You have trouble breathing in enough air.  You have an uncomfortable feeling in your back, arms, or stomach.  You have trouble talking.  One side of your body feels weak.  A part of your face is hanging down. These symptoms may be an emergency. Do not wait to see if the symptoms will go away. Get medical help right away. Call your local emergency services (911 in the [place] Do not drive yourself to the hospital. Summary  This condition affects breathing during sleep.  The most common cause is a collapsed or blocked airway.  The goal of treatment is to help you breathe normally while you sleep. This information is not intended to replace advice given to you by your health care provider. Make sure you discuss any questions you have with your health care provider. Document Revised: 12/11/2017 Document Reviewed: 10/20/2017 Elsevier Patient Education  2020 Elsevier Inc.  

## 2019-08-15 NOTE — Progress Notes (Signed)
I have read the note, and I agree with the clinical assessment and plan.  Nosson Wender A. Devlon Dosher, MD, PhD, FAAN Certified in Neurology, Clinical Neurophysiology, Sleep Medicine, Pain Medicine and Neuroimaging  [facility] [address], [address] [place], [place] [address] [phone]  

## 2019-08-15 NOTE — Progress Notes (Signed)
PATIENT: Maria Brennan DOB: 1945/03/25  REASON FOR VISIT: follow up HISTORY FROM: patient  Chief Complaint  Patient presents with  . Follow-up    Rm 1 here for a f/u on cpap and supplies. pt is having no problem     HISTORY OF PRESENT ILLNESS: Today 08/15/19 Maria Brennan is a 74 y.o. female here today for follow up for OSA on CPAP therapy.  She reports that she is doing well with therapy.  She states that she is only here for renewal of supplies.  She has reached out to her DME company but was made aware that she needed a follow-up appointment.  She denies any concerns with her CPAP machine.  She reports consistent usage of CPAP therapy.  She does admit to not taking her CPAP on vacation.  She also reports that when family comes to spend the night with her she will sleep in a different room and not take her CPAP with her.  Compliance report dated 05/16/2019 through 08/13/2019 reveals that she has used CPAP therapy 55 of the past 90 days for compliance of 61%.  She used CPAP greater than 4 hours 34 of the past 90 days for compliance of 8%.  Average usage on days used was 4 hours and 45 minutes.  Residual AHI was 5.2 on a set pressure of 7 cm of water and an EPR of 3.  There was no significant leak noted.  She is currently followed by pain management.  REVIEW OF SYSTEMS: Out of a complete 14 system review of symptoms, the patient complains only of the following symptoms, none and all other reviewed systems are negative.  ALLERGIES: Allergies  Allergen Reactions  . Rosuvastatin Other (See Comments)  . Codeine Itching and Nausea Only    HOME MEDICATIONS: Outpatient Medications Prior to Visit  Medication Sig Dispense Refill  . atorvastatin (LIPITOR) 80 MG tablet Take 80 mg by mouth daily at 6 PM.     . Biotin 10 MG TABS Take by mouth.    . Cholecalciferol (VITAMIN D3) 2000 units TABS Take 2,000 Units by mouth daily.    Marland Kitchen docusate sodium (COLACE) 100 MG capsule Take 1 capsule (100 mg  total) by mouth 2 (two) times daily. 10 capsule 0  . DULoxetine (CYMBALTA) 30 MG capsule Take 30 mg by mouth daily.   0  . GLUCOSAMINE-CHONDROITIN PO Take 1 tablet by mouth 2 (two) times daily.    Marland Kitchen HYDROcodone-acetaminophen (NORCO/VICODIN) 5-325 MG tablet Take one or two daily, not to exceed 45/month 45 tablet 0  . liraglutide (VICTOZA) 18 MG/3ML SOPN Inject into the skin.    . magnesium oxide (MAG-OX) 400 MG tablet Take 400 mg by mouth daily.    . metFORMIN (GLUCOPHAGE) 500 MG tablet Take 500 mg by mouth daily.  0  . omeprazole (PRILOSEC) 40 MG capsule Take 40 mg by mouth every evening.   0  . triamterene-hydrochlorothiazide (MAXZIDE-25) 37.5-25 MG tablet Take 0.5 tablets by mouth daily.     . Turmeric 500 MG TABS Take by mouth.    Marland Kitchen UNABLE TO FIND Collagen Bone Powder    . zolpidem (AMBIEN) 5 MG tablet Take 1 tablet (5 mg total) by mouth at bedtime. 30 tablet 5   No facility-administered medications prior to visit.    PAST MEDICAL HISTORY: Past Medical History:  Diagnosis Date  . Arthritis    oa  . Diabetes mellitus without complication ([place])   . Family history of adverse reaction to  anesthesia    mother and sister slow to awaken   . GERD (gastroesophageal reflux disease)   . History of hiatal hernia   . Hypertension   . MVP (mitral valve prolapse)   . PONV (postoperative nausea and vomiting) yrs ago    PAST SURGICAL HISTORY: Past Surgical History:  Procedure Laterality Date  . ABDOMINAL HYSTERECTOMY     partial  . BREAST SURGERY Bilateral    implants  . rotator  cuff Right   . 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 TOTAL SHOULDER ARTHROPLASTY      FAMILY HISTORY: Family History  Problem Relation Age of Onset  . Heart disease Mother   . Diabetes Mother   . Migraines Mother   . Diabetes Father   . Non-Hodgkin's lymphoma Father     . Urticaria Father   . Urticaria Sister   . Breast cancer Sister   . Asthma Brother   . Urticaria Brother   . Angioedema Brother   . Bladder Cancer Brother   . Eczema Maternal Grandmother   . Allergic rhinitis Daughter   . Lupus Daughter   . Eczema Daughter   . Heart disease Brother     SOCIAL HISTORY: Social History   Socioeconomic History  . Marital status: Married    Spouse name: Not on file  . Number of children: Not on file  . Years of education: Not on file  . Highest education level: Not on file  Occupational History  . Not on file  Tobacco Use  . Smoking status: Former Smoker    Packs/day: 0.50    Years: 53.00    Pack years: 26.50    Types: Cigarettes  . Smokeless tobacco: Never Used  Substance and Sexual Activity  . Alcohol use: No  . Drug use: No  . Sexual activity: Not on file  Other Topics Concern  . Not on file  Social History Narrative  . Not on file   Social Determinants of Health   Financial Resource Strain:   . Difficulty of Paying Living Expenses:   Food Insecurity:   . Worried About Charity fundraiser in the Last Year:   . Arboriculturist in the Last Year:   Transportation Needs:   . Film/video editor (Medical):   Marland Kitchen Lack of Transportation (Non-Medical):   Physical Activity:   . Days of Exercise per Week:   . Minutes of Exercise per Session:   Stress:   . Feeling of Stress :   Social Connections:   . Frequency of Communication with Friends and Family:   . Frequency of Social Gatherings with Friends and Family:   . Attends Religious Services:   . Active Member of Clubs or Organizations:   . Attends Archivist Meetings:   Marland Kitchen Marital Status:   Intimate Partner Violence:   . Fear of Current or Ex-Partner:   . Emotionally Abused:   Marland Kitchen Physically Abused:   . Sexually Abused:       PHYSICAL EXAM  Vitals:   08/15/19 1356  BP: (!) 108/55  Pulse: 83  Weight: 186 lb (84.4 kg)  Height: 5\' 8"  (1.727 m)   Body mass  index is 28.28 kg/m.  Generalized: Well developed, in no acute distress  Cardiology: normal rate and rhythm, no murmur noted Respiratory: clear to auscultation bilaterally  Neurological  examination  Mentation: Alert oriented to time, place, history taking. Follows all commands speech and language fluent Cranial nerve II-XII: Pupils were equal round reactive to light. Extraocular movements were full, visual field were full  Motor: The motor testing reveals 5 over 5 strength of all 4 extremities. Good symmetric motor tone is noted throughout.  Gait and station: Gait is normal.   DIAGNOSTIC DATA (LABS, IMAGING, TESTING) - I reviewed patient records, labs, notes, testing and imaging myself where available.  No flowsheet data found.   Lab Results  Component Value Date   WBC 7.0 05/07/2017   HGB 12.6 05/07/2017   HCT 37.7 05/07/2017   MCV 92 05/07/2017   PLT 317 05/07/2017      Component Value Date/Time   NA 136 11/21/2015 0429   K 4.5 11/21/2015 0429   CL 105 11/21/2015 0429   CO2 26 11/21/2015 0429   GLUCOSE 150 (H) 11/21/2015 0429   BUN 18 11/21/2015 0429   CREATININE 1.00 11/30/2017 1912   CALCIUM 8.9 11/21/2015 0429   GFRNONAA >60 11/21/2015 0429   GFRAA >60 11/21/2015 0429   No results found for: CHOL, HDL, LDLCALC, LDLDIRECT, TRIG, CHOLHDL Lab Results  Component Value Date   HGBA1C 6.0 (H) 11/09/2015   No results found for: VITAMINB12 No results found for: TSH     ASSESSMENT AND PLAN 74 y.o. year old female  has a past medical history of Arthritis, Diabetes mellitus without complication ([place]), Family history of adverse reaction to anesthesia, GERD (gastroesophageal reflux disease), History of hiatal hernia, Hypertension, MVP (mitral valve prolapse), and PONV (postoperative nausea and vomiting) (yrs ago). here with     ICD-10-CM   1. Obstructive sleep apnea treated with continuous positive airway pressure (CPAP)  G47.33 For home use only DME continuous positive  airway pressure (CPAP)   Z99.89     Mrs. Sarris reports that she is doing well on CPAP therapy.  I have reviewed her compliance report which reveals suboptimal compliance.  We have reviewed risk of untreated sleep apnea as well as insurance requirements.  She was educated on the need to have a minimum of 70% compliance from a daily and 4-hour perspective.  I encouraged her to use CPAP nightly and for greater than 4 hours each night.  I will send orders to her DME company for new supplies.  I will have her follow-up with me in 3 months to review compliance data.  She verbalizes understanding and agreement with this plan.   Orders Placed This Encounter  Procedures  . For home use only DME continuous positive airway pressure (CPAP)    Supplies    Order Specific Question:   Length of Need    Answer:   Lifetime    Order Specific Question:   Patient has OSA or probable OSA    Answer:   Yes    Order Specific Question:   Is the patient currently using CPAP in the home    Answer:   Yes    Order Specific Question:   Settings    Answer:   Other see comments    Order Specific Question:   CPAP supplies needed    Answer:   Mask, headgear, cushions, filters, heated tubing and water chamber     No orders of the defined types were placed in this encounter.     I spent 15 minutes with the patient. 50% of this time was spent counseling and educating patient on plan of care and medications.  Debbora Presto, FNP-C 08/15/2019, 4:38 PM [facility] [address], [place], [place] [address] [phone]

## 2019-08-17 ENCOUNTER — Telehealth: Payer: Self-pay

## 2019-08-17 NOTE — Telephone Encounter (Signed)
Pt will call back for dme

## 2019-08-17 NOTE — Telephone Encounter (Signed)
Patient called back and stated she gets her CPAP replacements from [facility]. Their phone number is [phone].

## 2019-08-23 NOTE — Progress Notes (Signed)
cpap orders faxed to [facility]  Faxed conformation received

## 2019-08-31 NOTE — Telephone Encounter (Signed)
Relayed to pt

## 2019-08-31 NOTE — Telephone Encounter (Signed)
Pt states she has spoke to the DME place and they informed her that they never received the order for a new part for her cpap machine. Pt would like order resent.

## 2019-08-31 NOTE — Telephone Encounter (Addendum)
I called and spoke to Maudie Mercury at [facility] re: order for cpcp supplies.  I refaxed order to her at [phone] with fax confirmation received.

## 2019-08-31 NOTE — Telephone Encounter (Signed)
Orders signed and faxed to Kim at [facility] for pts cpap supplies.

## 2019-11-21 ENCOUNTER — Ambulatory Visit: Payer: Medicare Other | Admitting: Family Medicine

## 2020-12-13 ENCOUNTER — Telehealth (HOSPITAL_COMMUNITY): Payer: Self-pay | Admitting: Adult Health

## 2020-12-13 NOTE — Telephone Encounter (Addendum)
ERROR Please disregard.     Davaun Quintela NP-C  10:37 AM

## 2021-05-28 LAB — HM DIABETES EYE EXAM

## 2021-07-15 ENCOUNTER — Telehealth: Payer: Self-pay

## 2021-07-15 NOTE — Telephone Encounter (Signed)
Pt would like to transfer care to you and was referred by Catarina Hartshorn whom is your current patient. ?

## 2021-07-15 NOTE — Telephone Encounter (Signed)
OK 

## 2021-08-12 ENCOUNTER — Telehealth: Payer: Self-pay | Admitting: Family

## 2021-08-12 ENCOUNTER — Ambulatory Visit (INDEPENDENT_AMBULATORY_CARE_PROVIDER_SITE_OTHER): Payer: Medicare Other | Admitting: Family

## 2021-08-12 ENCOUNTER — Encounter: Payer: Self-pay | Admitting: Family

## 2021-08-12 DIAGNOSIS — E538 Deficiency of other specified B group vitamins: Secondary | ICD-10-CM

## 2021-08-12 DIAGNOSIS — M329 Systemic lupus erythematosus, unspecified: Secondary | ICD-10-CM | POA: Diagnosis not present

## 2021-08-12 DIAGNOSIS — Z1231 Encounter for screening mammogram for malignant neoplasm of breast: Secondary | ICD-10-CM

## 2021-08-12 DIAGNOSIS — I10 Essential (primary) hypertension: Secondary | ICD-10-CM

## 2021-08-12 DIAGNOSIS — Z9989 Dependence on other enabling machines and devices: Secondary | ICD-10-CM

## 2021-08-12 DIAGNOSIS — E785 Hyperlipidemia, unspecified: Secondary | ICD-10-CM | POA: Diagnosis not present

## 2021-08-12 DIAGNOSIS — E119 Type 2 diabetes mellitus without complications: Secondary | ICD-10-CM

## 2021-08-12 DIAGNOSIS — B351 Tinea unguium: Secondary | ICD-10-CM | POA: Insufficient documentation

## 2021-08-12 DIAGNOSIS — E611 Iron deficiency: Secondary | ICD-10-CM | POA: Diagnosis not present

## 2021-08-12 DIAGNOSIS — G4700 Insomnia, unspecified: Secondary | ICD-10-CM

## 2021-08-12 DIAGNOSIS — G4733 Obstructive sleep apnea (adult) (pediatric): Secondary | ICD-10-CM

## 2021-08-12 DIAGNOSIS — F419 Anxiety disorder, unspecified: Secondary | ICD-10-CM

## 2021-08-12 DIAGNOSIS — H8110 Benign paroxysmal vertigo, unspecified ear: Secondary | ICD-10-CM

## 2021-08-12 DIAGNOSIS — M5134 Other intervertebral disc degeneration, thoracic region: Secondary | ICD-10-CM

## 2021-08-12 DIAGNOSIS — Z87891 Personal history of nicotine dependence: Secondary | ICD-10-CM

## 2021-08-12 DIAGNOSIS — R3129 Other microscopic hematuria: Secondary | ICD-10-CM

## 2021-08-12 LAB — IRON: Iron: 94 ug/dL (ref 42–145)

## 2021-08-12 LAB — CBC WITH DIFFERENTIAL/PLATELET
Basophils Absolute: 0 10*3/uL (ref 0.0–0.1)
Basophils Relative: 0.6 % (ref 0.0–3.0)
Eosinophils Absolute: 0.1 10*3/uL (ref 0.0–0.7)
Eosinophils Relative: 2.7 % (ref 0.0–5.0)
HCT: 37.6 % (ref 36.0–46.0)
Hemoglobin: 12.8 g/dL (ref 12.0–15.0)
Lymphocytes Relative: 31 % (ref 12.0–46.0)
Lymphs Abs: 1.7 10*3/uL (ref 0.7–4.0)
MCHC: 34 g/dL (ref 30.0–36.0)
MCV: 91.8 fl (ref 78.0–100.0)
Monocytes Absolute: 0.5 10*3/uL (ref 0.1–1.0)
Monocytes Relative: 8.8 % (ref 3.0–12.0)
Neutro Abs: 3 10*3/uL (ref 1.4–7.7)
Neutrophils Relative %: 56.9 % (ref 43.0–77.0)
Platelets: 247 10*3/uL (ref 150.0–400.0)
RBC: 4.1 Mil/uL (ref 3.87–5.11)
RDW: 12.9 % (ref 11.5–15.5)
WBC: 5.3 10*3/uL (ref 4.0–10.5)

## 2021-08-12 LAB — COMPREHENSIVE METABOLIC PANEL
ALT: 16 U/L (ref 0–35)
AST: 23 U/L (ref 0–37)
Albumin: 4.4 g/dL (ref 3.5–5.2)
Alkaline Phosphatase: 73 U/L (ref 39–117)
BUN: 16 mg/dL (ref 6–23)
CO2: 29 mEq/L (ref 19–32)
Calcium: 10 mg/dL (ref 8.4–10.5)
Chloride: 101 mEq/L (ref 96–112)
Creatinine, Ser: 0.81 mg/dL (ref 0.40–1.20)
GFR: 71.43 mL/min (ref >60.00)
Glucose, Bld: 109 mg/dL — ABNORMAL HIGH (ref 70–99)
Potassium: 3.8 mEq/L (ref 3.5–5.1)
Sodium: 140 mEq/L (ref 135–145)
Total Bilirubin: 0.5 mg/dL (ref 0.2–1.2)
Total Protein: 6.8 g/dL (ref 6.0–8.3)

## 2021-08-12 LAB — VITAMIN B12: Vitamin B-12: 918 pg/mL — ABNORMAL HIGH (ref 211–911)

## 2021-08-12 LAB — MICROALBUMIN / CREATININE URINE RATIO
Creatinine,U: 133.5 mg/dL
Microalb Creat Ratio: 1.3 mg/g (ref 0.0–30.0)
Microalb, Ur: 1.7 mg/dL (ref 0.0–1.9)

## 2021-08-12 LAB — HEMOGLOBIN A1C: Hgb A1c MFr Bld: 6.4 % (ref 4.6–6.5)

## 2021-08-12 MED ORDER — TERBINAFINE HCL 250 MG PO TABS: 250.0000 mg | ORAL_TABLET | Freq: Every day | ORAL | 0 refills | Status: DC

## 2021-08-12 MED ORDER — ROSUVASTATIN CALCIUM 20 MG PO TABS
20.0000 mg | ORAL_TABLET | Freq: Every day | ORAL | 1 refills | Status: DC
Start: 2021-08-12 — End: 2022-04-01

## 2021-08-12 NOTE — Assessment & Plan Note (Signed)
Followed by Rheumatology- Dr. Amil Amen.

## 2021-08-12 NOTE — Telephone Encounter (Signed)
Release request faxed

## 2021-08-12 NOTE — Assessment & Plan Note (Signed)
Obtain lipid panel. Continue rosuvastatin '20mg'$ .

## 2021-08-12 NOTE — Progress Notes (Signed)
Subjective:   By signing my name below, I, Shehryar Baig, attest that this documentation has been prepared under the direction and in the presence of Debbrah Alar, NP 08/12/2021    Patient ID: Maria Brennan, female    DOB: 02/08/1946, 76 y.o.   MRN: [id number]  Chief Complaint  Patient presents with   New Patient (Initial Visit)    HPI Patient is in today for a new patient visit.   Lethargy- She reports recently feeling more fatigued and lethargic. She typically is outdoors and completes yard work and housework daily but found this year she has difficulty completing those tasks and feels more of an urge to lay down but she does not. She tries to stay active daily. She wears a CPAP machine every night while sleeping. She is willing to see her sleep specialist to find any new issues with her machine.   Dizziness- She complains of constant mild dizziness for the past 6 months. She has a history of dizziness and has tried physical therapy in the past and found crystals in both ears were not aligned. She is not interested in seeing a physical therapist at this time.   Vitamin B12- She has history of low B12 levels and is interested in checking her levels during her next lab visit.   ADHD- She reports being distracted constantly. She finds she gets off topic and distracted while talking frequently. She thinks she may have ADHD. She does not work at this time.   Breast implant/Mammogram- She reports having her breast implants removed a couple years ago due to a rupture. She notes having breast implant surgery in the 1970's. She has not had a mammogram since her last procedure. She is interested in setting up an appointment.   Lupus- She has a history of lupus. She reports being recommended a medication to manage her lupus but is hesitant to take it due to it lowering her immune system. She continues seeing her rheumatologist to manage her lupus.   Insomnia- She is taking 5 mg Ambien  daily PO. She has been taking it for years and reports it is not as effective as when she started taking it.   Hydrocodone- She has pain in her back from her ruptured disk. Her pain is worst during the morning after she wakes up. She is taking 5-325 mg Norco/Vicodin daily PO and reports doing well while taking it. She is receiving these medications from her pain management specialist.   Diabetes- She was diagnosed with diabetes in her mid 30's. She is not taking metformin. She stopped taking it after she lost weight from when she was 200 lb's. She is taking 18/62m victoza.  Lab Results  Component Value Date   HGBA1C 6.0 (H) 11/09/2015   Blood pressure- She has a history of high blood pressure. She is taking 37.5-25 mg Maxzide-25 daily PO and reports no new issues while taking it.  BP Readings from Last 3 Encounters:  08/15/19 (!) 108/55  08/25/18 126/72  07/30/18 120/67   Pulse Readings from Last 3 Encounters:  08/15/19 83  08/25/18 91  07/30/18 89   Cholesterol- She is taking 20 mg rosuvastatin daily PO and reports no new issues while taking it.    Cymbalta- She reports being prescribed Cymbalta for nerve damage from her neurologist. Later she was prescribed a higher dose after she was experiencing increased stress from her family. She is currently taking 30 mg Cymbalta and reports doing well while taking it. She  reports generally having anxiety.   Vision- She is UTD on vision care. She reads frequently and finds her eye sight is worsening.   Colonoscopy- She reports her last colonoscopy was 5 years ago and she was told to repeat it in 10 years.   Melanoma- She has a melanoma on her scalp and back. She is seeing a dermatologist to manage them. She reports having an upcomming appointment to have them removed.   Social history- She is married and has 2 daughters. She has 4 grandchildren. She has worked in the past and worked in an [employer], broadcasting/film/video. She has one dog. She enjoys doing  yard work, spending time with her children, hanging out with her friends. She has a history of smoking since 1970's-2019. She has never completed a lung CT scan and is interested in completing one.   UTI- She has a history of frequent UTI's. She notes her urine typically has microscopic blood and tissues. She notes her siblings have a history of UTI's. Her brother has a history of bladder cancer.    Health Maintenance Due  Topic Date Due   COVID-19 Vaccine (1) Never done   FOOT EXAM  Never done   OPHTHALMOLOGY EXAM  Never done   URINE MICROALBUMIN  Never done   Hepatitis C Screening  Never done   COLONOSCOPY (Pts 45-69yr Insurance coverage will need to be confirmed)  Never done   MAMMOGRAM  Never done   DEXA SCAN  Never done   Pneumonia Vaccine 76 Years old (357 01/14/2015   HEMOGLOBIN A1C  05/08/2016   TETANUS/TDAP  06/24/2016   Zoster Vaccines- Shingrix (2 of 2) 11/05/2017    Past Medical History:  Diagnosis Date   Arthritis    oa   Diabetes mellitus without complication ([facility] [place]    Family history of adverse reaction to anesthesia    mother and sister slow to awaken    GERD (gastroesophageal reflux disease)    History of hiatal hernia    Hypertension    Insomnia    [place]anoma ([facility]    followed at [facility]   MVP (mitral valve prolapse)    PONV (postoperative nausea and vomiting) yrs ago   SLE (systemic lupus erythematosus related syndrome) ([facility]     Past Surgical History:  Procedure Laterality Date   ABDOMINAL HYSTERECTOMY     partial   BREAST SURGERY Bilateral    implants   rotator  cuff Right    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   Procedure: RIGHT TOTAL KNEE ARTHROPLASTY;  Surgeon: MParalee Cancel MD;  Location: [facility];  Service: Orthopedics;  Laterality: Right;   TOTAL SHOULDER ARTHROPLASTY      Family History  Problem Relation Age of Onset   Heart disease Mother    Diabetes Mother    Migraines Mother    Diabetes Father    Non-Hodgkin's lymphoma  Father    Urticaria Father    Urticaria Sister    Breast cancer Sister    Asthma Brother    Urticaria Brother    Angioedema Brother    Bladder Cancer Brother    Eczema Maternal Grandmother    Allergic rhinitis Daughter    Lupus Daughter    Eczema Daughter    Heart disease Brother     Social History   Socioeconomic History   Marital status: Married    Spouse name: Not on file   Number of children: Not on file   Years of education: Not on file  Highest education level: Not on file  Occupational History   Not on file  Tobacco Use   Smoking status: Former    Packs/day: 0.50    Years: 53.00    Pack years: 26.50    Types: Cigarettes   Smokeless tobacco: Never  Vaping Use   Vaping Use: Never used  Substance and Sexual Activity   Alcohol use: No   Drug use: No   Sexual activity: Not on file  Other Topics Concern   Not on file  Social History Narrative   Married, husband is a English as a second language teacher and is disabled   2 daughters, one local and one in [place]    4 grandchildren grown   Retired from Probation officer   Has one dog   Enjoys Haematologist   Spending time with children   Friends with Catarina Hartshorn   Social Determinants of Health   Financial Resource Strain: Not on file  Food Insecurity: Not on file  Transportation Needs: Not on file  Physical Activity: Not on file  Stress: Not on file  Social Connections: Not on file  Intimate Partner Violence: Not on file    Outpatient Medications Prior to Visit  Medication Sig Dispense Refill   Biotin 10 MG TABS Take by mouth.     Cholecalciferol (VITAMIN D3) 2000 units TABS Take 2,000 Units by mouth daily.     docusate sodium (COLACE) 100 MG capsule Take 1 capsule (100 mg total) by mouth 2 (two) times daily. 10 capsule 0   DULoxetine (CYMBALTA) 30 MG capsule Take 30 mg by mouth daily.   0   GLUCOSAMINE-CHONDROITIN PO Take 1 tablet by mouth 2 (two) times daily.     HYDROcodone-acetaminophen (NORCO/VICODIN) 5-325 MG  tablet Take one or two daily, not to exceed 45/month 45 tablet 0   liraglutide (VICTOZA) 18 MG/3ML SOPN Inject into the skin.     magnesium oxide (MAG-OX) 400 MG tablet Take 400 mg by mouth daily.     metFORMIN (GLUCOPHAGE) 500 MG tablet Take 500 mg by mouth daily.  0   omeprazole (PRILOSEC) 40 MG capsule Take 40 mg by mouth every evening.   0   triamterene-hydrochlorothiazide (MAXZIDE-25) 37.5-25 MG tablet Take 0.5 tablets by mouth daily.      Turmeric 500 MG TABS Take by mouth.     UNABLE TO FIND Collagen Bone Powder     zolpidem (AMBIEN) 5 MG tablet Take 1 tablet (5 mg total) by mouth at bedtime. 30 tablet 5   atorvastatin (LIPITOR) 80 MG tablet Take 80 mg by mouth daily at 6 PM.      No facility-administered medications prior to visit.    Allergies  Allergen Reactions   Codeine Itching and Nausea Only    Review of Systems  Constitutional:  Positive for malaise/fatigue.  Neurological:  Positive for dizziness.      Objective:    Physical Exam Constitutional:      General: She is not in acute distress.    Appearance: Normal appearance. She is not ill-appearing.  HENT:     Head: Normocephalic and atraumatic.     Right Ear: External ear normal.     Left Ear: External ear normal.     Nose: Nose normal.     Mouth/Throat:     Mouth: Mucous membranes are moist.     Pharynx: Oropharynx is clear. No posterior oropharyngeal erythema.  Eyes:     Extraocular Movements: Extraocular movements intact.     Pupils: Pupils are  equal, round, and reactive to light.  Cardiovascular:     Rate and Rhythm: Normal rate and regular rhythm.     Heart sounds: Normal heart sounds. No murmur heard.   No gallop.  Pulmonary:     Effort: Pulmonary effort is normal. No respiratory distress.     Breath sounds: Normal breath sounds. No wheezing or rales.  Lymphadenopathy:     Cervical: No cervical adenopathy.  Skin:    General: Skin is warm and dry.     Comments: Hypertrophic toenails   Neurological:     Mental Status: She is alert and oriented to person, place, and time.  Psychiatric:        Judgment: Judgment normal.    There were no vitals taken for this visit. Wt Readings from Last 3 Encounters:  08/15/19 186 lb (84.4 kg)  07/30/18 191 lb 3.2 oz (86.7 kg)  07/19/18 189 lb (85.7 kg)       Assessment & Plan:   Problem List Items Addressed This Visit       Unprioritized   SLE (systemic lupus erythematosus related syndrome) ([place]) - Primary    Followed by Rheumatology- Dr. Amil Amen.        Onychomycosis    Not improving with topical application. Recommend oral lamisil. Will check baseline LFT's now and again in 1 month.        Relevant Medications   terbinafine (LAMISIL) 250 MG tablet   Obstructive sleep apnea treated with continuous positive airway pressure (CPAP)    She sees Dr. Felecia Shelling, neurology. Notes increased daytime somnolence. Recommended that she schedule a follow up with him so her settings can be evaluated.        Microscopic hematuria    Pt reports previous negative work up with urology.       Iron deficiency   Relevant Orders   CBC with Differential/Platelet   Iron   Insomnia    Fair control with ambien. Controlled substance contract today, obtain UDS.       Relevant Orders   DRUG MONITORING, PANEL 8 WITH CONFIRMATION, URINE   Hyperlipidemia    Obtain lipid panel. Continue rosuvastatin 60m.        Relevant Medications   rosuvastatin (CRESTOR) 20 MG tablet   History of tobacco abuse   Relevant Orders   CT CHEST LUNG CA SCREEN LOW DOSE W/O CM   Essential hypertension    BP Readings from Last 3 Encounters:  08/15/19 (!) 108/55  08/25/18 126/72  07/30/18 120/67  Stable on maxide-25. Continue same.       Relevant Medications   rosuvastatin (CRESTOR) 20 MG tablet   Other Relevant Orders   Comp Met (CMET)   Diabetes mellitus type 2 without retinopathy ([facility]    Not currently on metformin. Check A1C. She is on ozempic.   DM eye exam up to date.        Relevant Medications   rosuvastatin (CRESTOR) 20 MG tablet   Other Relevant Orders   Hemoglobin A1c   Urine Microalbumin w/creat. ratio   DDD (degenerative disc disease), thoracic    She is seen at [facility], she takes hydrocodone 1-2 times daily.         Benign paroxysmal positional vertigo    Chronic, has done vestibular PT in the past. Declines referral at this time.        B12 deficiency    Continues oral supplement. Check follow up b12 level.  Relevant Orders   B12   Anxiety    Stable on cymbalta. Continue same.        Other Visit Diagnoses     Encounter for screening mammogram for malignant neoplasm of breast       Relevant Orders   MM 3D SCREEN BREAST BILATERAL       52 minutes spent on today's visit. Time was spent interviewing patient and reviewing outside records, formulating medical plan.  Meds ordered this encounter  Medications   rosuvastatin (CRESTOR) 20 MG tablet    Sig: Take 1 tablet (20 mg total) by mouth daily.    Dispense:  90 tablet    Refill:  1    Order Specific Question:   Supervising Provider    Answer:   Penni Homans A [[id number]]   terbinafine (LAMISIL) 250 MG tablet    Sig: Take 1 tablet (250 mg total) by mouth daily.    Dispense:  30 tablet    Refill:  0    Order Specific Question:   Supervising Provider    Answer:   Penni Homans A [[id number]]    I, Nance Pear, NP, personally preformed the services described in this documentation.  All medical record entries made by the scribe were at my direction and in my presence.  I have reviewed the chart and discharge instructions (if applicable) and agree that the record reflects my personal performance and is accurate and complete. 08/12/2021   I,Shehryar Baig,acting as a scribe for Nance Pear, NP.,have documented all relevant documentation on the behalf of Nance Pear, NP,as directed by  Nance Pear, NP while in the  presence of Nance Pear, NP.   Nance Pear, NP

## 2021-08-12 NOTE — Assessment & Plan Note (Addendum)
Not currently on metformin. Check A1C. She is on ozempic.  DM eye exam up to date.

## 2021-08-12 NOTE — Assessment & Plan Note (Signed)
Continues oral supplement. Check follow up b12 level.

## 2021-08-12 NOTE — Assessment & Plan Note (Signed)
Stable on cymbalta. Continue same.  

## 2021-08-12 NOTE — Assessment & Plan Note (Signed)
Chronic, has done vestibular PT in the past. Declines referral at this time.

## 2021-08-12 NOTE — Assessment & Plan Note (Addendum)
Fair control with ambien. Controlled substance contract today, obtain UDS.

## 2021-08-12 NOTE — Assessment & Plan Note (Signed)
Pt reports previous negative work up with urology.

## 2021-08-12 NOTE — Telephone Encounter (Signed)
Please call [facility] and request copy of last colonoscopy.

## 2021-08-12 NOTE — Assessment & Plan Note (Signed)
BP Readings from Last 3 Encounters:  08/15/19 (!) 108/55  08/25/18 126/72  07/30/18 120/67   Stable on maxide-25. Continue same.

## 2021-08-12 NOTE — Assessment & Plan Note (Addendum)
She sees Dr. Felecia Shelling, neurology. Notes increased daytime somnolence. Recommended that she schedule a follow up with him so her settings can be evaluated.

## 2021-08-12 NOTE — Patient Instructions (Signed)
Please start lamisil once daily.  Welcome to [facility]!

## 2021-08-12 NOTE — Assessment & Plan Note (Signed)
She is seen at [facility], she takes hydrocodone 1-2 times daily.

## 2021-08-12 NOTE — Assessment & Plan Note (Signed)
Not improving with topical application. Recommend oral lamisil. Will check baseline LFT's now and again in 1 month.

## 2021-08-13 ENCOUNTER — Encounter: Payer: Self-pay | Admitting: Family

## 2021-08-13 DIAGNOSIS — I341 Nonrheumatic mitral (valve) prolapse: Secondary | ICD-10-CM | POA: Insufficient documentation

## 2021-08-13 DIAGNOSIS — C439 Malignant melanoma of skin, unspecified: Secondary | ICD-10-CM | POA: Insufficient documentation

## 2021-08-13 NOTE — Addendum Note (Signed)
Addended by: Jiles Prows on: 08/13/2021 09:27 AM   Modules accepted: Orders

## 2021-08-15 LAB — DRUG MONITORING, PANEL 8 WITH CONFIRMATION, URINE
6 Acetylmorphine: NEGATIVE ng/mL (ref <10)
Alcohol Metabolites: NEGATIVE ng/mL (ref <500)
Amphetamines: NEGATIVE ng/mL (ref <500)
Benzodiazepines: NEGATIVE ng/mL (ref <100)
Buprenorphine, Urine: NEGATIVE ng/mL (ref <5)
Cocaine Metabolite: NEGATIVE ng/mL (ref <150)
Codeine: NEGATIVE ng/mL (ref <50)
Creatinine: 139.4 mg/dL (ref >=20.0)
Hydrocodone: 1396 ng/mL — ABNORMAL HIGH (ref <50)
Hydromorphone: 282 ng/mL — ABNORMAL HIGH (ref <50)
MDMA: NEGATIVE ng/mL (ref <500)
Marijuana Metabolite: NEGATIVE ng/mL (ref <20)
Morphine: NEGATIVE ng/mL (ref <50)
Norhydrocodone: 2143 ng/mL — ABNORMAL HIGH (ref <50)
Opiates: POSITIVE ng/mL — AB (ref <100)
Oxidant: NEGATIVE ug/mL (ref <200)
Oxycodone: NEGATIVE ng/mL (ref <100)
pH: 5.2 (ref 4.5–9.0)

## 2021-08-15 LAB — DM TEMPLATE

## 2021-08-20 ENCOUNTER — Ambulatory Visit (HOSPITAL_BASED_OUTPATIENT_CLINIC_OR_DEPARTMENT_OTHER)
Admission: RE | Admit: 2021-08-20 | Discharge: 2021-08-20 | Disposition: A | Discharge status: Home / Self Care | Payer: Medicare Other | Source: Ambulatory Visit | Attending: Family | Admitting: Family

## 2021-08-20 ENCOUNTER — Encounter (HOSPITAL_BASED_OUTPATIENT_CLINIC_OR_DEPARTMENT_OTHER): Payer: Self-pay

## 2021-08-20 DIAGNOSIS — Z1231 Encounter for screening mammogram for malignant neoplasm of breast: Secondary | ICD-10-CM | POA: Insufficient documentation

## 2021-08-21 ENCOUNTER — Other Ambulatory Visit: Payer: Self-pay | Admitting: Family

## 2021-08-21 ENCOUNTER — Telehealth: Payer: Self-pay | Admitting: Family

## 2021-08-21 MED ORDER — ZOLPIDEM TARTRATE 5 MG PO TABS
5.0000 mg | ORAL_TABLET | Freq: Every day | ORAL | 2 refills | Status: DC
Start: 2021-08-21 — End: 2021-11-18

## 2021-08-21 NOTE — Telephone Encounter (Signed)
Pt would also like Melissa to take over prescribing all regular medications for her, if possible.

## 2021-08-21 NOTE — Telephone Encounter (Signed)
Pt would like Melissa to take over this medication.   Medication: zolpidem (AMBIEN) 5 MG tablet  Has the patient contacted their pharmacy? No.  Preferred Pharmacy:  [facility], [place] - [address]   [address] [place] [address]  Phone:  [phone]  Fax:  [phone]

## 2021-08-29 ENCOUNTER — Ambulatory Visit (HOSPITAL_BASED_OUTPATIENT_CLINIC_OR_DEPARTMENT_OTHER)
Admission: RE | Admit: 2021-08-29 | Discharge: 2021-08-29 | Disposition: A | Discharge status: Home / Self Care | Payer: Medicare Other | Source: Ambulatory Visit | Attending: Family | Admitting: Family

## 2021-08-29 DIAGNOSIS — Z87891 Personal history of nicotine dependence: Secondary | ICD-10-CM

## 2021-09-06 ENCOUNTER — Telehealth: Payer: Self-pay | Admitting: Family

## 2021-09-06 NOTE — Telephone Encounter (Signed)
Left message for patient to call back and schedule Medicare Annual Wellness Visit (AWV).   Please offer to do virtually or by telephone.  Left office number and my jabber [phone].  AWVI eligible as of 04/10/2021   Please schedule at anytime with Nurse Health Advisor.

## 2021-09-11 ENCOUNTER — Telehealth (INDEPENDENT_AMBULATORY_CARE_PROVIDER_SITE_OTHER): Payer: Medicare Other | Admitting: Family

## 2021-09-11 VITALS — BP 120/64 | Wt 183.0 lb

## 2021-09-11 DIAGNOSIS — E119 Type 2 diabetes mellitus without complications: Secondary | ICD-10-CM

## 2021-09-11 DIAGNOSIS — E785 Hyperlipidemia, unspecified: Secondary | ICD-10-CM

## 2021-09-11 DIAGNOSIS — B351 Tinea unguium: Secondary | ICD-10-CM | POA: Diagnosis not present

## 2021-09-11 DIAGNOSIS — Z9989 Dependence on other enabling machines and devices: Secondary | ICD-10-CM | POA: Diagnosis not present

## 2021-09-11 DIAGNOSIS — G4733 Obstructive sleep apnea (adult) (pediatric): Secondary | ICD-10-CM | POA: Diagnosis not present

## 2021-09-11 DIAGNOSIS — Z5181 Encounter for therapeutic drug level monitoring: Secondary | ICD-10-CM | POA: Diagnosis not present

## 2021-09-11 NOTE — Assessment & Plan Note (Signed)
Plan lipid panel tomorrow. Pt continues crestor.

## 2021-09-11 NOTE — Assessment & Plan Note (Signed)
Reports some improvement in the appearance of her toenails. She will return tomorrow for lab appointment to complete follow up LFT's. If normal, will continue lamisil for 3 additional weeks.

## 2021-09-11 NOTE — Assessment & Plan Note (Signed)
Lab Results  Component Value Date   HGBA1C 6.4 08/12/2021   HGBA1C 6.0 (H) 11/09/2015   Lab Results  Component Value Date   MICROALBUR 1.7 08/12/2021   CREATININE 0.81 08/12/2021   Last A1C was at goal.

## 2021-09-11 NOTE — Progress Notes (Signed)
MyChart Video Visit    Virtual Visit via Video Note   This visit type was conducted due to national recommendations for restrictions regarding the COVID-19 Pandemic (e.g. social distancing) in an effort to limit this patient's exposure and mitigate transmission in our community. This patient is at least at moderate risk for complications without adequate follow up. This format is felt to be most appropriate for this patient at this time. Physical exam was limited by quality of the video and audio technology used for the visit. CMA was able to get the patient set up on a video visit.  Patient location: Home Patient and provider in visit Provider location: Office  I discussed the limitations of evaluation and management by telemedicine and the availability of in person appointments. The patient expressed understanding and agreed to proceed.  Visit Date: 09/11/2021  Today's healthcare provider: Nance Pear, NP     Subjective:    Patient ID: Maria Brennan, female    DOB: 1946/02/21, 76 y.o.   MRN: [id number]  Chief Complaint  Patient presents with   Nail Problem    Follow up, "will be out of lamisil today"    HPI Patient is in today for a video visit.   Refills- She is requesting ar refill for 250 mg Lamisil daily PO.  Fatigue- Her fatigue comes and goes. She notes being more physically active since her last visit and reports feeling more fatigue due to it. She continues wearing a CPAP machine and reports no new issues while wearing it. She has not contacted a sleep specialist since her last visit and is interested in seeing one to calibrate her CPAP device settings.   Blood pressure-She recorded her blood pressure and reports it measured 120/64. BP Readings from Last 3 Encounters:  09/11/21 120/64  08/15/19 (!) 108/55  08/25/18 126/72   Pulse Readings from Last 3 Encounters:  08/15/19 83  08/25/18 91  07/30/18 89    Past Medical History:  Diagnosis Date    Arthritis    oa   Diabetes mellitus without complication (HCC)    Family history of adverse reaction to anesthesia    mother and sister slow to awaken    GERD (gastroesophageal reflux disease)    History of hiatal hernia    Hypertension    Insomnia    [place])    followed at [place].   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   Procedure: RIGHT TOTAL KNEE ARTHROPLASTY;  Surgeon: Paralee Cancel, MD;  Location: [facility];  Service: Orthopedics;  Laterality: Right;   TOTAL SHOULDER ARTHROPLASTY      Family History  Problem Relation Age of Onset   Heart disease Mother    Diabetes Mother    Migraines Mother    Diabetes Father    Non-Hodgkin's lymphoma Father    Urticaria Father    Urticaria Sister    Breast cancer Sister    Asthma Brother    Urticaria Brother    Angioedema Brother    Bladder Cancer Brother    Eczema Maternal Grandmother    Allergic rhinitis Daughter    Lupus Daughter  Eczema Daughter    Heart disease Brother     Social History   Socioeconomic History   Marital status: Married    Spouse name: Not on file   Number of children: Not on file   Years of education: Not on file   Highest education level: Not on file  Occupational History   Not on file  Tobacco Use   Smoking status: Former    Packs/day: 0.50    Years: 53.00    Total pack years: 26.50    Types: Cigarettes   Smokeless tobacco: Never  Vaping Use   Vaping Use: Never used  Substance and Sexual Activity   Alcohol use: No   Drug use: No   Sexual activity: Not on file  Other Topics Concern   Not on file  Social History Narrative   Married, husband is a English as a second language teacher and is disabled   2  daughters, one local and one in [place]    4 grandchildren grown   Retired from Probation officer   Has one dog   Enjoys Haematologist   Spending time with children   Friends with Catarina Hartshorn   Social Determinants of Health   Financial Resource Strain: Not on file  Food Insecurity: Not on file  Transportation Needs: Not on file  Physical Activity: Not on file  Stress: Not on file  Social Connections: Not on file  Intimate Partner Violence: Not on file    Outpatient Medications Prior to Visit  Medication Sig Dispense Refill   Biotin 10 MG TABS Take by mouth.     Cholecalciferol (VITAMIN D3) 2000 units TABS Take 2,000 Units by mouth daily.     docusate sodium (COLACE) 100 MG capsule Take 1 capsule (100 mg total) by mouth 2 (two) times daily. 10 capsule 0   DULoxetine (CYMBALTA) 30 MG capsule Take 30 mg by mouth daily.   0   GLUCOSAMINE-CHONDROITIN PO Take 1 tablet by mouth 2 (two) times daily.     HYDROcodone-acetaminophen (NORCO/VICODIN) 5-325 MG tablet Take one or two daily, not to exceed 45/month 45 tablet 0   losartan (COZAAR) 25 MG tablet Take 1 tablet by mouth daily.     magnesium oxide (MAG-OX) 400 MG tablet Take 400 mg by mouth daily.     metFORMIN (GLUCOPHAGE) 500 MG tablet Take 500 mg by mouth daily.  0   omeprazole (PRILOSEC) 40 MG capsule Take 40 mg by mouth every evening.   0   rosuvastatin (CRESTOR) 20 MG tablet Take 1 tablet (20 mg total) by mouth daily. 90 tablet 1   Semaglutide, 1 MG/DOSE, (OZEMPIC, 1 MG/DOSE,) 4 MG/3ML SOPN Ozempic     terbinafine (LAMISIL) 250 MG tablet Take 1 tablet (250 mg total) by mouth daily. 30 tablet 0   triamterene-hydrochlorothiazide (MAXZIDE-25) 37.5-25 MG tablet Take 0.5 tablets by mouth daily.      Turmeric 500 MG TABS Take by mouth.     UNABLE TO FIND Collagen Bone Powder     zolpidem (AMBIEN) 5 MG tablet Take 1 tablet (5 mg total) by mouth at bedtime. 30 tablet 2   No facility-administered medications prior to visit.     Allergies  Allergen Reactions   Codeine Itching and Nausea Only    ROS See HPI    Objective:    Physical Exam  BP 120/64   Wt 183 lb (83 kg)   BMI 27.83 kg/m  Wt Readings from Last 3 Encounters:  09/11/21 183 lb (83 kg)  08/15/19  186 lb (84.4 kg)  07/30/18 191 lb 3.2 oz (86.7 kg)    Gen: Awake, alert, no acute distress Resp: Breathing is even and non-labored Psych: calm/pleasant demeanor Neuro: Alert and Oriented x 3, + facial symmetry, speech is clear.      Assessment & Plan:   Problem List Items Addressed This Visit       Unprioritized   Onychomycosis    Reports some improvement in the appearance of her toenails. She will return tomorrow for lab appointment to complete follow up LFT's. If normal, will continue lamisil for 3 additional weeks.       Obstructive sleep apnea treated with continuous positive airway pressure (CPAP) - Primary   Relevant Orders   Ambulatory referral to Pulmonology   Hyperlipidemia    Plan lipid panel tomorrow. Pt continues crestor.        Relevant Orders   Lipid panel   Diabetes mellitus type 2 without retinopathy ([place])    Lab Results  Component Value Date   HGBA1C 6.4 08/12/2021   HGBA1C 6.0 (H) 11/09/2015   Lab Results  Component Value Date   MICROALBUR 1.7 08/12/2021   CREATININE 0.81 08/12/2021  Last A1C was at goal.       Other Visit Diagnoses     Therapeutic drug monitoring       Relevant Orders   Hepatic function panel        No orders of the defined types were placed in this encounter.   I discussed the assessment and treatment plan with the patient. The patient was provided an opportunity to ask questions and all were answered. The patient agreed with the plan and demonstrated an understanding of the instructions.   The patient was advised to call back or seek an in-person evaluation if the symptoms worsen or if the condition fails to improve as anticipated.  I provided 20 minutes of  face-to-face time during this encounter.   I,Shehryar Multimedia programmer as a Education administrator for [employer], NP.,have documented all relevant documentation on the behalf of Nance Pear, NP,as directed by  Nance Pear, NP while in the presence of Nance Pear, NP.   Nance Pear, NP [employer] [facility] [employer] [phone] (phone) [phone] (fax)  [place]

## 2021-09-12 ENCOUNTER — Other Ambulatory Visit: Payer: Self-pay | Admitting: Family

## 2021-09-12 ENCOUNTER — Other Ambulatory Visit (INDEPENDENT_AMBULATORY_CARE_PROVIDER_SITE_OTHER): Payer: Medicare Other

## 2021-09-12 DIAGNOSIS — E785 Hyperlipidemia, unspecified: Secondary | ICD-10-CM

## 2021-09-12 DIAGNOSIS — Z5181 Encounter for therapeutic drug level monitoring: Secondary | ICD-10-CM

## 2021-09-12 LAB — LIPID PANEL
Cholesterol: 181 mg/dL (ref 0–200)
HDL: 75.1 mg/dL (ref >39.00)
LDL Cholesterol: 83 mg/dL (ref 0–99)
NonHDL: 105.86
Total CHOL/HDL Ratio: 2
Triglycerides: 114 mg/dL (ref 0.0–149.0)
VLDL: 22.8 mg/dL (ref 0.0–40.0)

## 2021-09-12 LAB — HEPATIC FUNCTION PANEL
ALT: 17 U/L (ref 0–35)
AST: 23 U/L (ref 0–37)
Albumin: 4.3 g/dL (ref 3.5–5.2)
Alkaline Phosphatase: 64 U/L (ref 39–117)
Bilirubin, Direct: 0.1 mg/dL (ref 0.0–0.3)
Total Bilirubin: 0.6 mg/dL (ref 0.2–1.2)
Total Protein: 6.5 g/dL (ref 6.0–8.3)

## 2021-09-13 MED ORDER — TERBINAFINE HCL 250 MG PO TABS: 250.0000 mg | ORAL_TABLET | Freq: Every day | ORAL | 0 refills | Status: DC

## 2021-09-17 NOTE — Progress Notes (Signed)
This encounter was created in error - please disregard. Pt will need to be rescheduled voice  message left

## 2021-09-25 ENCOUNTER — Telehealth: Payer: Self-pay | Admitting: Family

## 2021-09-25 NOTE — Telephone Encounter (Signed)
Left message for patient to call back and schedule Medicare Annual Wellness Visit (AWV).   Please offer to do virtually or by telephone.  Left office number and my jabber [phone].  AWVI eligible as of  04/10/2021   Please schedule at anytime with Nurse Health Advisor.

## 2021-10-16 ENCOUNTER — Other Ambulatory Visit: Payer: Self-pay | Admitting: Family

## 2021-10-16 MED ORDER — TERBINAFINE HCL 250 MG PO TABS: 250.0000 mg | ORAL_TABLET | Freq: Every day | ORAL | 0 refills | Status: DC

## 2021-10-21 ENCOUNTER — Ambulatory Visit (INDEPENDENT_AMBULATORY_CARE_PROVIDER_SITE_OTHER): Payer: Medicare Other

## 2021-10-21 ENCOUNTER — Telehealth (HOSPITAL_BASED_OUTPATIENT_CLINIC_OR_DEPARTMENT_OTHER): Payer: Self-pay

## 2021-10-21 VITALS — Ht 68.0 in | Wt 183.0 lb

## 2021-10-21 DIAGNOSIS — Z780 Asymptomatic menopausal state: Secondary | ICD-10-CM | POA: Diagnosis not present

## 2021-10-21 DIAGNOSIS — Z0000 Encounter for general adult medical examination without abnormal findings: Secondary | ICD-10-CM

## 2021-10-21 NOTE — Progress Notes (Signed)
Subjective:   Maria Brennan is a 76 y.o. female who presents for an Initial Medicare Annual Wellness Visit.   I connected with Kaydense today by telephone and verified that I am speaking with the correct person using two identifiers. Location patient: home Location provider: work Persons participating in the virtual visit: patient, Marine scientist.    I discussed the limitations, risks, security and privacy concerns of performing an evaluation and management service by telephone and the availability of in person appointments. I also discussed with the patient that there may be a patient responsible charge related to this service. The patient expressed understanding and verbally consented to this telephonic visit.    Interactive audio and video telecommunications were attempted between this provider and patient, however failed, due to patient having technical difficulties OR patient did not have access to video capability.  We continued and completed visit with audio only.  Some vital signs may be absent or patient reported.   Time Spent with patient on telephone encounter: 30 minutes  Review of Systems     Cardiac Risk Factors include: advanced age (>76mn, >>42women);diabetes mellitus;dyslipidemia;hypertension     Objective:    Today's Vitals   10/21/21 1441 10/21/21 1442  Weight: 183 lb (83 kg)   Height: '5\' 8"'$  (1.727 m)   PainSc:  4    Body mass index is 27.83 kg/m.     10/21/2021    2:49 PM 11/20/2015    1:02 PM 11/09/2015   11:25 AM  Advanced Directives  Does Patient Have a Medical Advance Directive? Yes No No  Type of [place]amed[place] will    [place] of [place]? No - copy requested    Would patient like information on creating a medical advance directive?  No - patient declined information     Current Medications (verified) Outpatient Encounter Medications as of 10/21/2021  Medication Sig   Biotin 10 MG TABS Take  by mouth.   Cholecalciferol (VITAMIN D3) 2000 units TABS Take 2,000 Units by mouth daily.   DULoxetine (CYMBALTA) 30 MG capsule Take 30 mg by mouth daily.    GLUCOSAMINE-CHONDROITIN PO Take 1 tablet by mouth 2 (two) times daily.   HYDROcodone-acetaminophen (NORCO/VICODIN) 5-325 MG tablet Take one or two daily, not to exceed 45/month   losartan (COZAAR) 25 MG tablet Take 1 tablet by mouth daily.   magnesium oxide (MAG-OX) 400 MG tablet Take 400 mg by mouth daily.   omeprazole (PRILOSEC) 40 MG capsule Take 40 mg by mouth every evening.    rosuvastatin (CRESTOR) 20 MG tablet Take 1 tablet (20 mg total) by mouth daily.   Semaglutide, 1 MG/DOSE, (OZEMPIC, 1 MG/DOSE,) 4 MG/3ML SOPN Ozempic   terbinafine (LAMISIL) 250 MG tablet Take 1 tablet (250 mg total) by mouth daily.   triamterene-hydrochlorothiazide (MAXZIDE-25) 37.5-25 MG tablet Take 0.5 tablets by mouth daily.    Turmeric 500 MG TABS Take by mouth.   zolpidem (AMBIEN) 5 MG tablet Take 1 tablet (5 mg total) by mouth at bedtime.   docusate sodium (COLACE) 100 MG capsule Take 1 capsule (100 mg total) by mouth 2 (two) times daily.   metFORMIN (GLUCOPHAGE) 500 MG tablet Take 500 mg by mouth daily.   UNABLE TO FIND Collagen Bone Powder   No facility-administered encounter medications on file as of 10/21/2021.    Allergies (verified) Codeine   History: Past Medical History:  Diagnosis Date   Arthritis    oa  Diabetes mellitus without complication (HCC)    Family history of adverse reaction to anesthesia    mother and sister slow to awaken    GERD (gastroesophageal reflux disease)    History of hiatal hernia    Hypertension    Insomnia    Melanoma ([place])    followed at [place].   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   Procedure: RIGHT TOTAL KNEE ARTHROPLASTY;  Surgeon: Paralee Cancel, MD;  Location: [facility];  Service: Orthopedics;  Laterality: Right;   TOTAL SHOULDER ARTHROPLASTY     Family History  Problem Relation Age of Onset   Heart disease Mother    Diabetes Mother    Migraines Mother    Diabetes Father    Non-Hodgkin's lymphoma Father    Urticaria Father    Urticaria Sister    Breast cancer Sister    Asthma Brother    Urticaria Brother    Angioedema Brother    Bladder Cancer Brother    Eczema Maternal Grandmother    Allergic rhinitis Daughter    Lupus Daughter    Eczema Daughter    Heart disease Brother    Social History   Socioeconomic History   Marital status: Married    Spouse name: Not on file   Number of children: Not on file   Years of education: Not on file   Highest education level: Not on file  Occupational History   Not on file  Tobacco Use   Smoking status: Former    Packs/day: 0.50    Years: 53.00    Total pack years: 26.50    Types: Cigarettes   Smokeless tobacco: Never  Vaping Use   Vaping Use: Never used  Substance and Sexual Activity   Alcohol use: No   Drug use: No   Sexual activity: Not on file  Other Topics Concern   Not on file  Social History Narrative   Married, husband is a English as a second language teacher and is disabled   2 daughters, one local and one in [place]    4 grandchildren grown   Retired from Probation officer   Has one dog   Enjoys Haematologist   Spending time with children   Friends with [place] Determinants of Health   Financial Resource Strain: Low Risk  (10/21/2021)   Overall Financial Resource Strain (CARDIA)    Difficulty of Paying Living Expenses: Not hard at all  Food Insecurity: No Food Insecurity (10/21/2021)   Hunger Vital Sign    Worried About Running Out of Food in the Last Year: Never true    [place] in the  Last Year: Never true  Transportation Needs: No Transportation Needs (10/21/2021)   PRAPARE - Hydrologist (Medical): No    Lack of Transportation (Non-Medical): No  Physical Activity: Inactive (10/21/2021)   Exercise Vital Sign    Days of Exercise per Week: 0 days    Minutes of Exercise per Session: 0 min  Stress: No Stress Concern Present (10/21/2021)   [place]    Feeling of Stress :  Not at all  Social Connections: Moderately Integrated (10/21/2021)   Social Connection and Isolation Panel [NHANES]    Frequency of Communication with Friends and Family: More than three times a week    Frequency of Social Gatherings with Friends and Family: More than three times a week    Attends Religious Services: More than 4 times per year    Active Member of [employer] or Organizations: No    Attends Music therapist: Never    Marital Status: Married    Tobacco Counseling Counseling given: Not Answered   Clinical Intake:  Pre-visit preparation completed: Yes  Pain : 0-10 Pain Score: 4  Pain Type: Chronic pain (sees pain management) Pain Location: Neck (and knee pain) Pain Onset: More than a month ago Pain Frequency: Constant     BMI - recorded: 27.83 Nutritional Status: BMI 25 -29 Overweight Nutritional Risks: None Diabetes: Yes CBG done?: No Did pt. bring in CBG monitor from home?: No (phone visit)  How often do you need to have someone help you when you read instructions, pamphlets, or other written materials from your doctor or pharmacy?: 1 - Never  Diabetes:  Is the patient diabetic?  Yes  If diabetic, was a CBG obtained today?  No  Did the patient bring in their glucometer from home?  No phone visit How often do you monitor your CBG's? never.   Financial Strains and Diabetes Management:  Are you having any financial strains with the device, your supplies or your  medication? No .  Does the patient want to be seen by Chronic Care Management for management of their diabetes?  No  Would the patient like to be referred to a Nutritionist or for Diabetic Management?  No   Diabetic Exams:  Diabetic Eye Exam: Completed 05/28/2021.   Diabetic Foot Exam: Pt has been advised about the importance in completing this exam. O be completed by PCP Interpreter Needed?: No  Information entered by :: Caroleen Hamman LPN   Activities of Daily Living    10/21/2021    2:52 PM  In your present state of health, do you have any difficulty performing the following activities:  Hearing? 0  Vision? 0  Difficulty concentrating or making decisions? 0  Walking or climbing stairs? 1  Comment stairs sometimes due to knee pain  Dressing or bathing? 0  Doing errands, shopping? 0  Preparing Food and eating ? N  Using the Toilet? N  In the past six months, have you accidently leaked urine? N  Do you have problems with loss of bowel control? N  Managing your Medications? N  Managing your Finances? N  Housekeeping or managing your Housekeeping? N    Patient Care Team: Debbrah Alar, NP as PCP - General (Internal Medicine)  Indicate any recent Medical Services you may have received from other than [facility] providers in the past year (date may be approximate).     Assessment:   This is a routine wellness examination for [place].  Hearing/Vision screen Hearing Screening - Comments:: No issues Vision Screening - Comments:: Last eye exam-05/2021-Dr. Tepidino  Dietary issues and exercise activities discussed: Current Exercise Habits: The patient does not participate in regular exercise at present, Exercise limited by: None identified   Goals Addressed             This Visit's Progress    Patient Stated       Develop better sleep habits & drink more water  Depression Screen    10/21/2021    2:51 PM  PHQ 2/9 Scores  PHQ - 2 Score 0    Fall Risk     10/21/2021    2:49 PM  [place] in the past year? 0  Number falls in past yr: 0  Injury with Fall? 0  Follow up Falls prevention discussed    [place]:  Any stairs in or around the home? Yes  If so, are there any without handrails? No  Home free of loose throw rugs in walkways, pet beds, electrical cords, etc? Yes  Adequate lighting in your home to reduce risk of falls? Yes   ASSISTIVE DEVICES UTILIZED TO PREVENT FALLS:  Life alert? No  Use of a cane, walker or w/c? No  Grab bars in the bathroom? Yes  Shower chair or bench in shower? Yes  Elevated toilet seat or a handicapped toilet? Yes   TIMED UP AND GO:  Was the test performed? No . Phone visit  Cognitive Function:Normal cognitive status assessed by this Nurse Health Advisor. No abnormalities found.          Immunizations Immunization History  Administered Date(s) Administered   Influenza, High Dose Seasonal PF 01/25/2016, 12/18/2016, 12/09/2017, 11/24/2019, 01/29/2021   Influenza-Unspecified 11/15/2013, 12/12/2014   Pneumococcal Conjugate-13 01/13/2014   Pneumococcal Polysaccharide-23 04/21/2005, 06/17/2017   Tdap 06/25/2006   Zoster Recombinat (Shingrix) 09/10/2017   Zoster, Live 07/22/2011    TDAP status: Due, Education has been provided regarding the importance of this vaccine. Advised may receive this vaccine at local pharmacy or Health Dept. Aware to provide a copy of the vaccination record if obtained from local pharmacy or Health Dept. Verbalized acceptance and understanding.  Flu Vaccine status: Up to date  Pneumococcal vaccine status: Up to date  Covid-19 vaccine status: Information provided on how to obtain vaccines.   Qualifies for Shingles Vaccine? Yes   Zostavax completed No   Shingrix Completed?: No.    Education has been provided regarding the importance of this vaccine. Patient has been advised to call insurance company to determine out of pocket  expense if they have not yet received this vaccine. Advised may also receive vaccine at local pharmacy or Health Dept. Verbalized acceptance and understanding.  Screening Tests Health Maintenance  Topic Date Due   COVID-19 Vaccine (1) Never done   FOOT EXAM  Never done   Hepatitis C Screening  Never done   DEXA SCAN  Never done   TETANUS/TDAP  06/24/2016   Zoster Vaccines- Shingrix (2 of 2) 11/05/2017   INFLUENZA VACCINE  10/08/2021   HEMOGLOBIN A1C  02/11/2022   OPHTHALMOLOGY EXAM  05/29/2022   MAMMOGRAM  08/21/2023   COLONOSCOPY (Pts 45-66yr Insurance coverage will need to be confirmed)  07/13/2028   Pneumonia Vaccine 75 Years old  Completed   HPV VACCINES  Aged Out    Health Maintenance  Health Maintenance Due  Topic Date Due   COVID-19 Vaccine (1) Never done   FOOT EXAM  Never done   Hepatitis C Screening  Never done   DEXA SCAN  Never done   TETANUS/TDAP  06/24/2016   Zoster Vaccines- Shingrix (2 of 2) 11/05/2017   INFLUENZA VACCINE  10/08/2021    Colorectal cancer screening: Type of screening: Colonoscopy. Completed 07/14/2018. Repeat every 10 years  Mammogram status: Completed bilateral 08/20/2021. Repeat every year  Bone Density status: Ordered today. Pt provided with contact info and advised to  call to schedule appt.  Lung Cancer Screening: (Low Dose CT Chest recommended if Age 66-80 years, 30 pack-year currently smoking OR have quit w/in 15years.) does not qualify.     Additional Screening:  Hepatitis C Screening: does qualify; Patient to discuss with PCP  Vision Screening: Recommended annual ophthalmology exams for early detection of glaucoma and other disorders of the eye. Is the patient up to date with their annual eye exam?  Yes  Who is the provider or what is the name of the office in which the patient attends annual eye exams? Dr Bradley Ferris   Dental Screening: Recommended annual dental exams for proper oral hygiene  Community Resource Referral /  Chronic Care Management: CRR required this visit?  No   CCM required this visit?  No      Plan:     I have personally reviewed and noted the following in the patient's chart:   Medical and social history Use of alcohol, tobacco or illicit drugs  Current medications and supplements including opioid prescriptions. Patient is currently taking opioid prescriptions. Information provided to patient regarding non-opioid alternatives. Patient advised to discuss non-opioid treatment plan with their provider. Functional ability and status Nutritional status Physical activity Advanced directives List of other physicians Hospitalizations, surgeries, and ER visits in previous 12 months Vitals Screenings to include cognitive, depression, and falls Referrals and appointments  In addition, I have reviewed and discussed with patient certain preventive protocols, quality metrics, and best practice recommendations. A written personalized care plan for preventive services as well as general preventive health recommendations were provided to patient.   Due to this being a telephonic visit, the after visit summary with patients personalized plan was offered to patient via mail or my-chart. Patient would like to access on my-chart.   Marta Antu, LPN   [id number]  Nurse Health Advisor  Nurse Notes: None

## 2021-10-21 NOTE — Patient Instructions (Signed)
Maria Brennan , Thank you for taking time to complete your Medicare Wellness Visit. I appreciate your ongoing commitment to your health goals. Please review the following plan we discussed and let me know if I can assist you in the future.   Screening recommendations/referrals: Colonoscopy: Completed 07/14/2018-Due 07/13/2028 Mammogram: Completed 08/20/2021-Due 08/21/2022 Bone Density: Ordered today. Someone will call you to schedule. Recommended yearly ophthalmology/optometry visit for glaucoma screening and checkup Recommended yearly dental visit for hygiene and checkup  Vaccinations: Influenza vaccine: Up to date Pneumococcal vaccine: Up to date Tdap vaccine: Due-May obtain vaccine at your local pharmacy. Shingles vaccine: First dose completed   Covid-19:Vaccine available at your local pharmacy  Advanced directives: Please bring a copy of Living Will and/or [place] for your chart.   Conditions/risks identified: See problem list  Next appointment: Follow up in one year for your annual wellness visit    Preventive Care 65 Years and Older, Female Preventive care refers to lifestyle choices and visits with your health care provider that can promote health and wellness. What does preventive care include? A yearly physical exam. This is also called an annual well check. Dental exams once or twice a year. Routine eye exams. Ask your health care provider how often you should have your eyes checked. Personal lifestyle choices, including: Daily care of your teeth and gums. Regular physical activity. Eating a healthy diet. Avoiding tobacco and drug use. Limiting alcohol use. Practicing safe sex. Taking low-dose aspirin every day. Taking vitamin and mineral supplements as recommended by your health care provider. What happens during an annual well check? The services and screenings done by your health care provider during your annual well check will depend on your age,  overall health, lifestyle risk factors, and family history of disease. Counseling  Your health care provider may ask you questions about your: Alcohol use. Tobacco use. Drug use. Emotional well-being. Home and relationship well-being. Sexual activity. Eating habits. History of falls. Memory and ability to understand (cognition). Work and work Statistician. Reproductive health. Screening  You may have the following tests or measurements: Height, weight, and BMI. Blood pressure. Lipid and cholesterol levels. These may be checked every 5 years, or more frequently if you are over 51 years old. Skin check. Lung cancer screening. You may have this screening every year starting at age 98 if you have a 30-pack-year history of smoking and currently smoke or have quit within the past 15 years. Fecal occult blood test (FOBT) of the stool. You may have this test every year starting at age 35. Flexible sigmoidoscopy or colonoscopy. You may have a sigmoidoscopy every 5 years or a colonoscopy every 10 years starting at age 70. Hepatitis C blood test. Hepatitis B blood test. Sexually transmitted disease (STD) testing. Diabetes screening. This is done by checking your blood sugar (glucose) after you have not eaten for a while (fasting). You may have this done every 1-3 years. Bone density scan. This is done to screen for osteoporosis. You may have this done starting at age 47. Mammogram. This may be done every 1-2 years. Talk to your health care provider about how often you should have regular mammograms. Talk with your health care provider about your test results, treatment options, and if necessary, the need for more tests. Vaccines  Your health care provider may recommend certain vaccines, such as: Influenza vaccine. This is recommended every year. Tetanus, diphtheria, and acellular pertussis (Tdap, Td) vaccine. You may need a Td booster every 10 years.  Zoster vaccine. You may need this after age  32. Pneumococcal 13-valent conjugate (PCV13) vaccine. One dose is recommended after age 71. Pneumococcal polysaccharide (PPSV23) vaccine. One dose is recommended after age 57. Talk to your health care provider about which screenings and vaccines you need and how often you need them. This information is not intended to replace advice given to you by your health care provider. Make sure you discuss any questions you have with your health care provider. Document Released: 03/23/2015 Document Revised: 11/14/2015 Document Reviewed: 12/26/2014 Elsevier Interactive Patient Education  2017 [place] Prevention in the Home Falls can cause injuries. They can happen to people of all ages. There are many things you can do to make your home safe and to help prevent falls. What can I do on the outside of my home? Regularly fix the edges of walkways and driveways and fix any cracks. Remove anything that might make you trip as you walk through a door, such as a raised step or threshold. Trim any bushes or trees on the path to your home. Use bright outdoor lighting. Clear any walking paths of anything that might make someone trip, such as rocks or tools. Regularly check to see if handrails are loose or broken. Make sure that both sides of any steps have handrails. Any raised decks and porches should have guardrails on the edges. Have any leaves, snow, or ice cleared regularly. Use sand or salt on walking paths during winter. Clean up any spills in your garage right away. This includes oil or grease spills. What can I do in the bathroom? Use night lights. Install grab bars by the toilet and in the tub and shower. Do not use towel bars as grab bars. Use non-skid mats or decals in the tub or shower. If you need to sit down in the shower, use a plastic, non-slip stool. Keep the floor dry. Clean up any water that spills on the floor as soon as it happens. Remove soap buildup in the tub or shower  regularly. Attach bath mats securely with double-sided non-slip rug tape. Do not have throw rugs and other things on the floor that can make you trip. What can I do in the bedroom? Use night lights. Make sure that you have a light by your bed that is easy to reach. Do not use any sheets or blankets that are too big for your bed. They should not hang down onto the floor. Have a firm chair that has side arms. You can use this for support while you get dressed. Do not have throw rugs and other things on the floor that can make you trip. What can I do in the kitchen? Clean up any spills right away. Avoid walking on wet floors. Keep items that you use a lot in easy-to-reach places. If you need to reach something above you, use a strong step stool that has a grab bar. Keep electrical cords out of the way. Do not use floor polish or wax that makes floors slippery. If you must use wax, use non-skid floor wax. Do not have throw rugs and other things on the floor that can make you trip. What can I do with my stairs? Do not leave any items on the stairs. Make sure that there are handrails on both sides of the stairs and use them. Fix handrails that are broken or loose. Make sure that handrails are as long as the stairways. Check any carpeting to make sure that  it is firmly attached to the stairs. Fix any carpet that is loose or worn. Avoid having throw rugs at the top or bottom of the stairs. If you do have throw rugs, attach them to the floor with carpet tape. Make sure that you have a light switch at the top of the stairs and the bottom of the stairs. If you do not have them, ask someone to add them for you. What else can I do to help prevent falls? Wear shoes that: Do not have high heels. Have rubber bottoms. Are comfortable and fit you well. Are closed at the toe. Do not wear sandals. If you use a stepladder: Make sure that it is fully opened. Do not climb a closed stepladder. Make sure that  both sides of the stepladder are locked into place. Ask someone to hold it for you, if possible. Clearly mark and make sure that you can see: Any grab bars or handrails. First and last steps. Where the edge of each step is. Use tools that help you move around (mobility aids) if they are needed. These include: Canes. Walkers. Scooters. Crutches. Turn on the lights when you go into a dark area. Replace any light bulbs as soon as they burn out. Set up your furniture so you have a clear path. Avoid moving your furniture around. If any of your floors are uneven, fix them. If there are any pets around you, be aware of where they are. Review your medicines with your doctor. Some medicines can make you feel dizzy. This can increase your chance of falling. Ask your doctor what other things that you can do to help prevent falls. This information is not intended to replace advice given to you by your health care provider. Make sure you discuss any questions you have with your health care provider. Document Released: 12/21/2008 Document Revised: 08/02/2015 Document Reviewed: 03/31/2014 Elsevier Interactive Patient Education  2017 [employer].

## 2021-10-31 ENCOUNTER — Encounter (HOSPITAL_BASED_OUTPATIENT_CLINIC_OR_DEPARTMENT_OTHER): Payer: Self-pay

## 2021-10-31 ENCOUNTER — Other Ambulatory Visit (HOSPITAL_BASED_OUTPATIENT_CLINIC_OR_DEPARTMENT_OTHER): Payer: Medicare Other

## 2021-11-06 ENCOUNTER — Encounter: Payer: Self-pay | Admitting: Pulmonary Disease

## 2021-11-06 ENCOUNTER — Ambulatory Visit (INDEPENDENT_AMBULATORY_CARE_PROVIDER_SITE_OTHER): Payer: Medicare Other | Admitting: Pulmonary Disease

## 2021-11-06 VITALS — BP 120/60 | HR 89 | Ht 68.0 in | Wt 185.8 lb

## 2021-11-06 DIAGNOSIS — G4733 Obstructive sleep apnea (adult) (pediatric): Secondary | ICD-10-CM

## 2021-11-06 NOTE — Patient Instructions (Signed)
Will arrange for new medical supply company to get a new auto CPAP machine, and will have them refit you for a different CPAP mask.  Follow up in 4 months.

## 2021-11-06 NOTE — Progress Notes (Signed)
[facility]  Chief Complaint  Patient presents with   Consult    Consult: OSA    Past Surgical History:  She  has a past surgical history that includes Abdominal hysterectomy; rotator  cuff (Right); Breast surgery (Bilateral); Total knee arthroplasty (Right, 11/20/2015); and Total shoulder arthroplasty.  Past Medical History:  Arthritis, DM type 2, GERD, HH, HTN, Melanoma, MVP, SLE, Raynaud's disease  Constitutional:  BP 120/60 (BP Location: Left Arm)   Pulse 89   Ht '5\' 8"'$  (1.727 m)   Wt 185 lb 12.8 oz (84.3 kg)   SpO2 95%   BMI 28.25 kg/m   Brief Summary:  Maria Brennan is a 76 y.o. female former smoker with obstructive sleep apnea.      Subjective:   She had a sleep study in March 2018.  She was found to have severe sleep apnea.  She was set up with CPAP 7 cm H2O.    She uses CPAP on a regular basis.  She has been using a nasal pillow mask, but this isn't comfortable anymore.  She also feels like she needs more pressure from her machine.  She goes to sleep at 130 am.  She falls asleep after 30 to 45 minutes.  She sleeps through the night.  She gets out of bed at 9 am.  She feels okay in the morning.  She denies morning headache.  She uses ambien to help sleep.  She drinks coffee in the morning.  She naps in the afternoon on Sunday after going to church.  She denies sleep walking, sleep talking, bruxism, or nightmares.  There is no history of restless legs.  She denies sleep hallucinations, sleep paralysis, or cataplexy.  The Epworth score is 13 out of 24.   Physical Exam:   Appearance - well kempt   ENMT - no sinus tenderness, no oral exudate, no LAN, Mallampati 3 airway, no stridor  Respiratory - equal breath sounds bilaterally, no wheezing or rales  CV - s1s2 regular rate and rhythm, no murmurs  Ext - no clubbing, no edema  Skin - no rashes  Psych - normal mood and affect   Chest Imaging:  LDCT chest 08/29/21 >>  mild paraseptal and centrilobular emphysema, scattered nodules up to 5.8 mm in RUL  Sleep Tests:  PSG 05/26/16 >> AHI 40.4, SpO2 low 79% CPAP 07/07/16 >> CPAP 7 cm H2O  Social History:  She  reports that she has quit smoking. Her smoking use included cigarettes. She has a 26.50 pack-year smoking history. She has never used smokeless tobacco. She reports that she does not drink alcohol and does not use drugs.  Family History:  Her family history includes Allergic rhinitis in her daughter; Angioedema in her brother; Asthma in her brother; Bladder Cancer in her brother; Breast cancer in her sister; Diabetes in her father and mother; Eczema in her daughter and maternal grandmother; Heart disease in her brother and mother; Lupus in her daughter; Migraines in her mother; Non-Hodgkin's lymphoma in her father; Urticaria in her brother, father, and sister.     Assessment/Plan:   Obstructive sleep apnea. - she is compliant with CPAP and reports benefit from therapy - she was using [facility], but wants to switch to a different DME that accepts [facility] - her current CPAP is more than 75 yrs old - will arrange for a new [employer] auto CPAP 5 - 15 cm H2O - will arrange for mask refitting  Obesity. - discussed how weight can impact sleep and risk for sleep disordered breathing - discussed options to assist with weight loss: combination of diet modification, cardiovascular and strength training exercises  Cardiovascular risk. - had an extensive discussion regarding the adverse health consequences related to untreated sleep disordered breathing - specifically discussed the risks for hypertension, coronary artery disease, cardiac dysrhythmias, cerebrovascular disease, and diabetes - lifestyle modification discussed  Safe driving practices. - discussed how sleep disruption can increase risk of accidents, particularly when driving - safe driving practices were discussed  Therapies for obstructive  sleep apnea. - if the sleep study shows significant sleep apnea, then various therapies for treatment were reviewed: CPAP, oral appliance, and surgical interventions  Time Spent Involved in Patient Care on Day of Examination:  36 minutes  Follow up:   Patient Instructions  Will arrange for new medical supply company to get a new auto CPAP machine, and will have them refit you for a different CPAP mask.  Follow up in 4 months.  Medication List:   Allergies as of 11/06/2021       Reactions   Codeine Itching, Nausea Only        Medication List        Accurate as of November 06, 2021  2:42 PM. If you have any questions, ask your nurse or doctor.          STOP taking these medications    docusate sodium 100 MG capsule Commonly known as: COLACE Stopped by: Chesley Mires, MD   metFORMIN 500 MG tablet Commonly known as: GLUCOPHAGE Stopped by: Chesley Mires, MD   UNABLE TO FIND Stopped by: Chesley Mires, MD       TAKE these medications    Biotin 10 MG Tabs Take by mouth.   DULoxetine 30 MG capsule Commonly known as: CYMBALTA Take 30 mg by mouth daily.   GLUCOSAMINE-CHONDROITIN PO Take 1 tablet by mouth 2 (two) times daily.   HYDROcodone-acetaminophen 5-325 MG tablet Commonly known as: NORCO/VICODIN Take one or two daily, not to exceed 45/month   losartan 25 MG tablet Commonly known as: COZAAR Take 1 tablet by mouth daily.   magnesium oxide 400 MG tablet Commonly known as: MAG-OX Take 400 mg by mouth daily.   omeprazole 40 MG capsule Commonly known as: PRILOSEC Take 40 mg by mouth every evening.   Ozempic (1 MG/DOSE) 4 MG/3ML Sopn Generic drug: Semaglutide (1 MG/DOSE) Ozempic   rosuvastatin 20 MG tablet Commonly known as: Crestor Take 1 tablet (20 mg total) by mouth daily.   terbinafine 250 MG tablet Commonly known as: LamISIL Take 1 tablet (250 mg total) by mouth daily.   triamterene-hydrochlorothiazide 37.5-25 MG tablet Commonly known as:  MAXZIDE-25 Take 0.5 tablets by mouth daily.   Turmeric 500 MG Tabs Take by mouth.   Vitamin D3 50 MCG (2000 UT) Tabs Take 2,000 Units by mouth daily.   zolpidem 5 MG tablet Commonly known as: AMBIEN Take 1 tablet (5 mg total) by mouth at bedtime.        Signature:  Chesley Mires, MD [place] Pager - [phone] 11/06/2021, 2:42 PM

## 2021-11-13 ENCOUNTER — Telehealth: Payer: Self-pay | Admitting: Pulmonary Disease

## 2021-11-13 NOTE — Telephone Encounter (Signed)
Called and spoke with Maria Brennan.  Cpap titration and split night results located and faxed to [phone].  Confirmation received.  Nothing further at this time.

## 2021-11-14 ENCOUNTER — Other Ambulatory Visit: Payer: Self-pay | Admitting: Family

## 2021-11-14 ENCOUNTER — Telehealth: Payer: Self-pay | Admitting: Family

## 2021-11-14 MED ORDER — TERBINAFINE HCL 250 MG PO TABS: 250.0000 mg | ORAL_TABLET | Freq: Every day | ORAL | 0 refills | Status: DC

## 2021-11-14 NOTE — Telephone Encounter (Signed)
Medication:   zolpidem (AMBIEN) 5 MG tablet [[id number]]   Has the patient contacted their pharmacy? No. (If no, request that the patient contact the pharmacy for the refill.) (If yes, when and what did the pharmacy advise?)  Preferred Pharmacy (with phone number or street name):   [place], [place] - [address]  [address] [place] [address]  Phone:  [phone]  Fax:  [phone]   Agent: Please be advised that RX refills may take up to 3 business days. We ask that you follow-up with your pharmacy.

## 2021-11-18 MED ORDER — ZOLPIDEM TARTRATE 5 MG PO TABS: 5.0000 mg | ORAL_TABLET | Freq: Every day | ORAL | 2 refills | Status: DC

## 2021-11-18 NOTE — Telephone Encounter (Signed)
Pt called back to follow up on Ambien refill and was wondering why it was taking so long. Pt was advised that Rod Holler was out on lunch and another note would be routed back as high priority to look into this matter for her. Pt acknowledged understanding.

## 2021-11-18 NOTE — Telephone Encounter (Signed)
Rx sent. Left message for pt on machine.

## 2021-11-18 NOTE — Telephone Encounter (Signed)
Patient called to advise that she still hasn't received the Zolpidem and she is totally out. Patient was advised that it can take up to 3 business days but that I would send request back for her.

## 2021-12-03 ENCOUNTER — Ambulatory Visit (HOSPITAL_BASED_OUTPATIENT_CLINIC_OR_DEPARTMENT_OTHER)
Admission: RE | Admit: 2021-12-03 | Discharge: 2021-12-03 | Disposition: A | Discharge status: Home / Self Care | Payer: Medicare Other | Source: Ambulatory Visit | Attending: Family | Admitting: Family

## 2021-12-03 DIAGNOSIS — Z780 Asymptomatic menopausal state: Secondary | ICD-10-CM | POA: Insufficient documentation

## 2021-12-05 ENCOUNTER — Encounter: Payer: Self-pay | Admitting: Family

## 2021-12-05 DIAGNOSIS — M8580 Other specified disorders of bone density and structure, unspecified site: Secondary | ICD-10-CM | POA: Insufficient documentation

## 2021-12-10 ENCOUNTER — Encounter: Payer: Self-pay | Admitting: Family

## 2021-12-30 ENCOUNTER — Ambulatory Visit: Payer: Medicare Other | Admitting: Family

## 2021-12-30 ENCOUNTER — Telehealth: Payer: Self-pay | Admitting: Family

## 2021-12-30 NOTE — Telephone Encounter (Signed)
Pt called to cancel her appt today. Stated she was very dizzy and had no one to take her to the appt, declined vv. She stated she was very dizzy but has been diagnosed with vertigo before but noticed a rash all over her body and face. Transferred to triage for nurse eval.

## 2021-12-30 NOTE — Telephone Encounter (Signed)
Appt scheduled for tomorrow.  °

## 2021-12-30 NOTE — Telephone Encounter (Signed)
Nurse Assessment Nurse: Curlene Labrum, RN, [place] Date/Time (Eastern Time): 12/30/2021 9:55:20 AM Confirm and document reason for call. If symptomatic, describe symptoms. ---Caller states that she has chronic vertigo -- she gets dizzy early in the day but it usually resolves. Last night she developed some itchiness on her buttocks and legs. This morning she was itchy every where -- head, neck, torso, and face. Caller states she still feels itchy, but denies any rash. She states she takes a hydrocodone in the evening and morning and isn't sure if its related - she states she has taken hydrocodone for years for chronic pain. Does the patient have any new or worsening symptoms? ---Yes Will a triage be completed? ---Yes Related visit to physician within the last 2 weeks? ---No Does the PT have any chronic conditions? (i.e. diabetes, asthma, this includes High risk factors for pregnancy, etc.) ---Yes List chronic conditions. ---Lupus Is this a behavioral health or substance abuse call? ---No Guidelines Guideline Title Affirmed Question Affirmed Notes Nurse Date/Time Eilene Ghazi Time) Itching - Widespread Taking prescription medication that could cause itching (e.g., Belac, RN, [place] 12/30/2021 10:00:24 AM PLEASE NOTE: All timestamps contained within this report are represented as [place] Standard Time. CONFIDENTIALTY NOTICE: This fax transmission is intended only for the addressee. It contains information that is legally privileged, confidential or otherwise protected from use or disclosure. If you are not the intended recipient, you are strictly prohibited from reviewing, disclosing, copying using or disseminating any of this information or taking any action in reliance on or regarding this information. If you have received this fax in error, please notify us immediately by telephone so that we can arrange for its return to [place]. Phone: [phone], Toll-Free: [phone], Fax:  [phone] Page: 2 of 2 Call Id: [id number] Guidelines Guideline Title Affirmed Question Affirmed Notes Nurse Date/Time Eilene Ghazi Time) codeine/morphine/ other opiates, aspirin) Disp. Time Eilene Ghazi Time) Disposition Final User 12/30/2021 10:03:41 AM Call PCP within 24 Hours Yes Belac, RN, [place]lin Final Disposition 12/30/2021 10:03:41 AM Call PCP within 24 Hours Yes Belac, RN, Pricilla Larsson Caller Disagree/Comply Comply Caller Understands Yes PreDisposition Did not know what to do Care Advice Given Per Guideline CALL PCP WITHIN 24 HOURS: * You need to discuss this with your doctor (or NP/PA) within the next 24 hours. * IF OFFICE WILL BE OPEN: Call the office when it opens tomorrow morning. CALL BACK IF: * You become worse CARE ADVICE given per Itching, Widespread (Adult) guideline. Comments User: Dimas Chyle, RN Date/Time Eilene Ghazi Time): 12/30/2021 10:08:32 AM Caller warm transferred to office for appt. Referrals REFERRED TO PCP OFFICE

## 2021-12-31 ENCOUNTER — Ambulatory Visit (INDEPENDENT_AMBULATORY_CARE_PROVIDER_SITE_OTHER): Payer: Medicare Other | Admitting: Family

## 2021-12-31 VITALS — BP 116/62 | HR 86 | Temp 98.2°F | Resp 16 | Wt 183.0 lb

## 2021-12-31 DIAGNOSIS — E119 Type 2 diabetes mellitus without complications: Secondary | ICD-10-CM | POA: Diagnosis not present

## 2021-12-31 DIAGNOSIS — B351 Tinea unguium: Secondary | ICD-10-CM

## 2021-12-31 DIAGNOSIS — Z23 Encounter for immunization: Secondary | ICD-10-CM | POA: Diagnosis not present

## 2021-12-31 DIAGNOSIS — I10 Essential (primary) hypertension: Secondary | ICD-10-CM

## 2021-12-31 DIAGNOSIS — M329 Systemic lupus erythematosus, unspecified: Secondary | ICD-10-CM

## 2021-12-31 DIAGNOSIS — E538 Deficiency of other specified B group vitamins: Secondary | ICD-10-CM

## 2021-12-31 DIAGNOSIS — F3341 Major depressive disorder, recurrent, in partial remission: Secondary | ICD-10-CM

## 2021-12-31 DIAGNOSIS — F419 Anxiety disorder, unspecified: Secondary | ICD-10-CM

## 2021-12-31 DIAGNOSIS — K219 Gastro-esophageal reflux disease without esophagitis: Secondary | ICD-10-CM

## 2021-12-31 DIAGNOSIS — G4709 Other insomnia: Secondary | ICD-10-CM

## 2021-12-31 MED ORDER — FUROSEMIDE 20 MG PO TABS: ORAL_TABLET | ORAL | 3 refills | Status: AC

## 2021-12-31 NOTE — Assessment & Plan Note (Signed)
Only mild improvement. D/c lamisil. Advised pt to schedule follow up with her podiatrist.

## 2021-12-31 NOTE — Assessment & Plan Note (Signed)
She is using ambien '5mg'$  HS prn.

## 2021-12-31 NOTE — Assessment & Plan Note (Addendum)
Lab Results  Component Value Date   HGBA1C 6.4 08/12/2021   HGBA1C 6.0 (H) 11/09/2015   Lab Results  Component Value Date   MICROALBUR 1.7 08/12/2021   LDLCALC 83 09/12/2021   CREATININE 0.81 08/12/2021   Not on metformin. Will check A1C and decide if we need to restart. She continues ozempic.

## 2021-12-31 NOTE — Progress Notes (Signed)
 Subjective:   By signing my name below, I, Maria Brennan, attest that this documentation has been prepared under the direction and in the presence of [name], NP 12/31/2021   Patient ID: Maria Brennan, female    DOB: 04/11/1945, 75 y.o.   MRN: [id number]  Chief Complaint  Patient presents with   Diabetes    Here for follow up   Hypertension    Here for follow up   Nail Problem    Takes lamisil     HPI Patient is in today for an office visit. She is accompanied by her partner.   Joint Replacement: She states that she was informed by specialists for a left knee and right shoulder replacement. She was suggested to receive infusions for her lupus and is wondering if that would interfere with her potentials surgery(ies)   Toe Nail: She is currently taking 250 Mg of Lamisil. She states that her left big toe is causing her aching. She is currently established with a podiatrist.   Itchiness: She complains of itchiness which she believes could be due to her Hydrocodone-Acetaminophen medication. She states that she has been on the medication for years.    Blood Sugars: She is currently taking 1 Mg/Dose of Ozempic medication.  Lab Results  Component Value Date   HGBA1C 6.4 08/12/2021   Diuretic: She reports of some leg swelling and is requesting a medication of Furosemide PRN  Sleep: She is currently taking 5 Mg of Ambien and reports that the medication is improving her symptoms.  Sleep Apnea: She met with her pulmonologist for an updated Cpap. She is not satisfied with the company in which she is receiving the Cpap.   Vitamin B12: She is taking an oral Vitamin B12 supplement daily  Heartburn: She reports that symptoms are resolved. She is currently taking 40 mg of Omeprazole  Mood: She reports that her mood is okay/ She is currently taking 30 mg of Cymbalta daily.   Immunizations: She is interested in receiving an influenza vaccine during today's visit.   Health  Maintenance Due  Topic Date Due   COVID-19 Vaccine (1) Never done   FOOT EXAM  Never done   Hepatitis C Screening  Never done   TETANUS/TDAP  06/24/2016   Zoster Vaccines- Shingrix (2 of 2) 11/05/2017    Past Medical History:  Diagnosis Date   Arthritis    oa   Diabetes mellitus without complication (HCC)    Family history of adverse reaction to anesthesia    mother and sister slow to awaken    GERD (gastroesophageal reflux disease)    History of hiatal hernia    Hypertension    Insomnia    Melanoma (HCC)    followed at [facility].   MVP (mitral valve prolapse)    Osteopenia    PONV (postoperative nausea and vomiting) yrs ago   SLE (systemic lupus erythematosus related syndrome) (HCC)     Past Surgical History:  Procedure Laterality Date   ABDOMINAL HYSTERECTOMY     partial   BREAST SURGERY Bilateral    implants   rotator  cuff Right    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   Procedure: RIGHT TOTAL KNEE ARTHROPLASTY;  Surgeon: Matthew Olin, MD;  Location: [facility];  Service: Orthopedics;  Laterality: Right;   TOTAL SHOULDER ARTHROPLASTY      Family History  Problem Relation Age of Onset   Heart disease Mother    Diabetes Mother    Migraines   Mother    Diabetes Father    Non-Hodgkin's lymphoma Father    Urticaria Father    Urticaria Sister    Breast cancer Sister    Asthma Brother    Urticaria Brother    Angioedema Brother    Bladder Cancer Brother    Eczema Maternal Grandmother    Allergic rhinitis Daughter    Lupus Daughter    Eczema Daughter    Heart disease Brother     Social History   Socioeconomic History   Marital status: Married    Spouse name: Not on file   Number of children: Not on file   Years of education: Not on file   Highest education level: Not on file  Occupational History   Not on file  Tobacco Use   Smoking status: Former    Packs/day: 0.50    Years: 53.00    Total pack years: 26.50    Types: Cigarettes   Smokeless  tobacco: Never  Vaping Use   Vaping Use: Never used  Substance and Sexual Activity   Alcohol use: No   Drug use: No   Sexual activity: Not on file  Other Topics Concern   Not on file  Social History Narrative   Married, husband is a English as a second language teacher and is disabled   2 daughters, one local and one in [place]    4 grandchildren grown   Retired from Probation officer   Has one dog   Enjoys Haematologist   Spending time with children   Friends with Catarina Hartshorn   Social Determinants of Health   Financial Resource Strain: Low Risk  (10/21/2021)   Overall Financial Resource Strain (CARDIA)    Difficulty of Paying Living Expenses: Not hard at all  Food Insecurity: No Food Insecurity (10/21/2021)   Hunger Vital Sign    Worried About Running Out of Food in the Last Year: Never true    [place] in the Last Year: Never true  Transportation Needs: No Transportation Needs (10/21/2021)   PRAPARE - Hydrologist (Medical): No    Lack of Transportation (Non-Medical): No  Physical Activity: Inactive (10/21/2021)   Exercise Vital Sign    Days of Exercise per Week: 0 days    Minutes of Exercise per Session: 0 min  Stress: No Stress Concern Present (10/21/2021)   [place]    Feeling of Stress : Not at all  Social Connections: Moderately Integrated (10/21/2021)   Social Connection and Isolation Panel [NHANES]    Frequency of Communication with Friends and Family: More than three times a week    Frequency of Social Gatherings with Friends and Family: More than three times a week    Attends Religious Services: More than 4 times per year    Active Member of [employer] or Organizations: No    Attends Archivist Meetings: Never    Marital Status: Married  Human resources officer Violence: Not At Risk (10/21/2021)   Humiliation, Afraid, Rape, and Kick questionnaire    Fear of Current or Ex-Partner: No     Emotionally Abused: No    Physically Abused: No    Sexually Abused: No    Outpatient Medications Prior to Visit  Medication Sig Dispense Refill   Biotin 10 MG TABS Take by mouth.     Cholecalciferol (VITAMIN D3) 2000 units TABS Take 2,000 Units by mouth daily.     DULoxetine (CYMBALTA) 30 MG  capsule Take 30 mg by mouth daily.   0   GLUCOSAMINE-CHONDROITIN PO Take 1 tablet by mouth 2 (two) times daily.     HYDROcodone-acetaminophen (NORCO/VICODIN) 5-325 MG tablet Take one or two daily, not to exceed 45/month 45 tablet 0   losartan (COZAAR) 25 MG tablet Take 1 tablet by mouth daily.     magnesium oxide (MAG-OX) 400 MG tablet Take 400 mg by mouth daily.     omeprazole (PRILOSEC) 40 MG capsule Take 40 mg by mouth every evening.   0   rosuvastatin (CRESTOR) 20 MG tablet Take 1 tablet (20 mg total) by mouth daily. 90 tablet 1   Semaglutide, 1 MG/DOSE, (OZEMPIC, 1 MG/DOSE,) 4 MG/3ML SOPN Ozempic     terbinafine (LAMISIL) 250 MG tablet Take 1 tablet (250 mg total) by mouth daily. 30 tablet 0   triamterene-hydrochlorothiazide (MAXZIDE-25) 37.5-25 MG tablet Take 0.5 tablets by mouth daily.      Turmeric 500 MG TABS Take by mouth.     zolpidem (AMBIEN) 5 MG tablet Take 1 tablet (5 mg total) by mouth at bedtime. 30 tablet 2   No facility-administered medications prior to visit.    Allergies  Allergen Reactions   Codeine Itching and Nausea Only    Review of Systems  Skin:  Positive for itching.       Objective:    Physical Exam Constitutional:      General: She is not in acute distress.    Appearance: Normal appearance. She is not ill-appearing.  HENT:     Head: Normocephalic and atraumatic.     Right Ear: External ear normal.     Left Ear: External ear normal.  Eyes:     Extraocular Movements: Extraocular movements intact.     Pupils: Pupils are equal, round, and reactive to light.  Cardiovascular:     Rate and Rhythm: Normal rate and regular rhythm.     Heart sounds: Normal  heart sounds. No murmur heard.    No gallop.  Pulmonary:     Effort: Pulmonary effort is normal. No respiratory distress.     Breath sounds: Normal breath sounds. No wheezing or rales.  Skin:    General: Skin is warm and dry.  Neurological:     Mental Status: She is alert and oriented to person, place, and time.  Psychiatric:        Mood and Affect: Mood normal.        Behavior: Behavior normal.        Judgment: Judgment normal.     BP 116/62 (BP Location: Right Arm, Patient Position: Sitting, Cuff Size: Small)   Pulse 86   Temp 98.2 F (36.8 C) (Oral)   Resp 16   Wt 183 lb (83 kg)   SpO2 99%   BMI 27.83 kg/m  Wt Readings from Last 3 Encounters:  12/31/21 183 lb (83 kg)  11/06/21 185 lb 12.8 oz (84.3 kg)  10/21/21 183 lb (83 kg)       Assessment & Plan:   Problem List Items Addressed This Visit       Unprioritized   SLE (systemic lupus erythematosus related syndrome) (HCC)    This is being managed by rheumatology.       Onychomycosis    Only mild improvement. D/c lamisil. Advised pt to schedule follow up with her podiatrist.       Major depression    Stable on cymbalta. Did not tolerate coming off but is feeling ok on cymbalta.         Insomnia    She is using ambien 82m HS prn.       GERD (gastroesophageal reflux disease)    Stable on omeprazole. Continue same.       Essential hypertension    BP Readings from Last 3 Encounters:  12/31/21 116/62  11/06/21 120/60  09/11/21 120/64  BP stable on losartan and maxide.        Relevant Medications   furosemide (LASIX) 20 MG tablet   Diabetes mellitus type 2 without retinopathy ([facility]    Lab Results  Component Value Date   HGBA1C 6.4 08/12/2021   HGBA1C 6.0 (H) 11/09/2015   Lab Results  Component Value Date   MICROALBUR 1.7 08/12/2021   LDLCALC 83 09/12/2021   CREATININE 0.81 08/12/2021  Not on metformin. Will check A1C and decide if we need to restart. She continues ozempic.       Relevant  Orders   Hemoglobin A1c   Comp Met (CMET)   B12 deficiency    Last level looks ok.        Anxiety    Stable on cymbalta 354m       Other Visit Diagnoses     Needs flu shot    -  Primary   Relevant Orders   Flu Vaccine QUAD High Dose(Fluad) (Completed)       Meds ordered this encounter  Medications   furosemide (LASIX) 20 MG tablet    Sig: 1 tablet by mouth once daily as needed for edema    Dispense:  30 tablet    Refill:  3    Order Specific Question:   Supervising Provider    Answer:   BLPenni Homans [[id number]]    I, MeNance PearNP, personally preformed the services described in this documentation.  All medical record entries made by the scribe were at my direction and in my presence.  I have reviewed the chart and discharge instructions (if applicable) and agree that the record reflects my personal performance and is accurate and complete. 12/31/2021   I,Maria Brennan,acting as a scribe for MeNance PearNP.,have documented all relevant documentation on the behalf of MeNance PearNP,as directed by  MeNance PearNP while in the presence of MeNance PearNP.    MeNance PearNP

## 2021-12-31 NOTE — Assessment & Plan Note (Signed)
Stable on omeprazole. Continue same.  ?

## 2021-12-31 NOTE — Assessment & Plan Note (Signed)
Last level looks ok.

## 2021-12-31 NOTE — Assessment & Plan Note (Signed)
Stable on cymbalta. Did not tolerate coming off but is feeling ok on cymbalta.

## 2021-12-31 NOTE — Assessment & Plan Note (Signed)
Stable on cymbalta '30mg'$ .

## 2021-12-31 NOTE — Assessment & Plan Note (Signed)
BP Readings from Last 3 Encounters:  12/31/21 116/62  11/06/21 120/60  09/11/21 120/64   BP stable on losartan and maxide.

## 2021-12-31 NOTE — Assessment & Plan Note (Signed)
This is being managed by rheumatology.

## 2022-01-01 LAB — COMPREHENSIVE METABOLIC PANEL
ALT: 17 U/L (ref 0–35)
AST: 22 U/L (ref 0–37)
Albumin: 4.3 g/dL (ref 3.5–5.2)
Alkaline Phosphatase: 63 U/L (ref 39–117)
BUN: 17 mg/dL (ref 6–23)
CO2: 30 mEq/L (ref 19–32)
Calcium: 10.1 mg/dL (ref 8.4–10.5)
Chloride: 99 mEq/L (ref 96–112)
Creatinine, Ser: 0.78 mg/dL (ref 0.40–1.20)
GFR: 74.53 mL/min (ref >60.00)
Glucose, Bld: 116 mg/dL — ABNORMAL HIGH (ref 70–99)
Potassium: 4.1 mEq/L (ref 3.5–5.1)
Sodium: 137 mEq/L (ref 135–145)
Total Bilirubin: 0.5 mg/dL (ref 0.2–1.2)
Total Protein: 6.6 g/dL (ref 6.0–8.3)

## 2022-01-01 LAB — HEMOGLOBIN A1C: Hgb A1c MFr Bld: 6.7 % — ABNORMAL HIGH (ref 4.6–6.5)

## 2022-01-09 ENCOUNTER — Encounter: Payer: Self-pay | Admitting: Family

## 2022-02-05 ENCOUNTER — Telehealth: Payer: Self-pay | Admitting: Family

## 2022-02-05 NOTE — Telephone Encounter (Signed)
Patient is close to out of losartan and ozempic. Patient requested that the ozempic be coded to reflect she is a diabetic instead of medicine being for weight loss.   Medication: losartan (COZAAR) 25 MG tablet   DULoxetine (CYMBALTA) 30 MG capsule   Semaglutide, 1 MG/DOSE, (OZEMPIC, 1 MG/DOSE,) 4 MG/3ML SOPN   Has the patient contacted their pharmacy? Yes.     Preferred Pharmacy (with phone number or street name):  [facility] [place], [place] - [address] [facility]     Phone: [phone]  Fax: [phone]    Agent: Please be advised that RX refills may take up to 3 business days. We ask that you follow-up with your pharmacy.

## 2022-02-06 NOTE — Telephone Encounter (Signed)
Do you want to do refills for these?  Last provider on all is historical provider.

## 2022-02-10 ENCOUNTER — Other Ambulatory Visit: Payer: Self-pay

## 2022-02-10 ENCOUNTER — Other Ambulatory Visit: Payer: Self-pay | Admitting: Family

## 2022-02-10 DIAGNOSIS — E119 Type 2 diabetes mellitus without complications: Secondary | ICD-10-CM

## 2022-02-10 MED ORDER — LOSARTAN POTASSIUM 25 MG PO TABS: 25.0000 mg | ORAL_TABLET | Freq: Every day | ORAL | 1 refills | Status: DC

## 2022-02-10 MED ORDER — DULOXETINE HCL 30 MG PO CPEP: 30.0000 mg | ORAL_CAPSULE | Freq: Every day | ORAL | 1 refills | Status: DC

## 2022-02-10 MED ORDER — OZEMPIC (1 MG/DOSE) 4 MG/3ML ~~LOC~~ SOPN: PEN_INJECTOR | SUBCUTANEOUS | 1 refills | Status: DC

## 2022-02-10 NOTE — Telephone Encounter (Signed)
Patient called upset stating she has been waiting for her medication for over a week and she still does not have them. Explained to patient that message was received on 11/29 and it can take up to 3 days for them to get refilled, and that we are closed over the weekend. Patient stated she had not called on the 29th, and had called a week and some days ago, and now she's going to be without her medication. Patient continued to say that her medication is by a mail service and now they wont have it in time when she goes out of town on Thursday. Patient would like a call back whenever her medication gets refilled.

## 2022-02-10 NOTE — Telephone Encounter (Signed)
Refills sent patient notified, I offered to send short supply to local pharmacy but she declined. She will call me back if she changes her mind.

## 2022-02-11 ENCOUNTER — Telehealth: Payer: Self-pay | Admitting: Family

## 2022-02-11 NOTE — Telephone Encounter (Signed)
Patient called to advise that her refills have been denied and she has been on them for 30 years. Advised that  the following prescriptions were sent to [facility].   DULoxetine (CYMBALTA) 30 MG capsule   Semaglutide, 1 MG/DOSE, (OZEMPIC, 1 MG/DOSE,) 4 MG/3ML SOPN  losartan (COZAAR) 25 MG tablet

## 2022-03-07 ENCOUNTER — Ambulatory Visit (INDEPENDENT_AMBULATORY_CARE_PROVIDER_SITE_OTHER): Payer: Medicare Other | Admitting: Medical

## 2022-03-07 VITALS — BP 114/70 | HR 98 | Temp 98.0°F | Resp 18 | Ht 68.0 in | Wt 173.0 lb

## 2022-03-07 DIAGNOSIS — L089 Local infection of the skin and subcutaneous tissue, unspecified: Secondary | ICD-10-CM

## 2022-03-07 MED ORDER — DOXYCYCLINE HYCLATE 100 MG PO TABS: 100.0000 mg | ORAL_TABLET | Freq: Two times a day (BID) | ORAL | 0 refills | Status: DC

## 2022-03-07 NOTE — Patient Instructions (Addendum)
Skin infection- infected sebaceous cyst or early abscess. Doxycycline(rx advisement). Continue warm compresses.  If area expands or worsens as discussed then be seen in ED or UC.  Give my chart up Tuesday or Wednesday then decide on follow up in our office.  Keep dermatologist appointment end of January.

## 2022-03-07 NOTE — Progress Notes (Signed)
Subjective:    Patient ID: Maria Brennan, female    DOB: Jun 23, 1945, 76 y.o.   MRN: [id number]  HPI  Rt lower back area of redness. Came up early in December. No vesicle appearance. Early on she did have fever but none since. Pt husband states the area has remained the same size.  Pt ask if we can give her injection.   Pt has appointment with dermatologist middle-end of January.    Review of Systems  Constitutional:  Negative for chills, fatigue and fever.  HENT:  Negative for congestion and ear discharge.   Respiratory:  Negative for cough, chest tightness, shortness of breath and wheezing.   Cardiovascular:  Negative for chest pain and palpitations.  Gastrointestinal:  Negative for abdominal pain.  Musculoskeletal:  Negative for back pain.  Skin:  Positive for rash.       See hpi.  Neurological:  Negative for dizziness and headaches.  Hematological:  Negative for adenopathy. Does not bruise/bleed easily.   Past Medical History:  Diagnosis Date   Arthritis    oa   Diabetes mellitus without complication (HCC)    Family history of adverse reaction to anesthesia    mother and sister slow to awaken    GERD (gastroesophageal reflux disease)    History of hiatal hernia    Hypertension    Insomnia    Melanoma ([facility])    followed at [facility].   MVP (mitral valve prolapse)    Osteopenia    PONV (postoperative nausea and vomiting) yrs ago   SLE (systemic lupus erythematosus related syndrome) ([facility])      Social History   Socioeconomic History   Marital status: Married    Spouse name: Not on file   Number of children: Not on file   Years of education: Not on file   Highest education level: Not on file  Occupational History   Not on file  Tobacco Use   Smoking status: Former    Packs/day: 0.50    Years: 53.00    Total pack years: 26.50    Types: Cigarettes   Smokeless tobacco: Never  Vaping Use   Vaping Use: Never used  Substance and Sexual Activity    Alcohol use: No   Drug use: No   Sexual activity: Not on file  Other Topics Concern   Not on file  Social History Narrative   Married, husband is a English as a second language teacher and is disabled   2 daughters, one local and one in [place]    4 grandchildren grown   Retired from Probation officer   Has one dog   Enjoys Haematologist   Spending time with children   Friends with Catarina Hartshorn   Social Determinants of Health   Financial Resource Strain: Low Risk  (10/21/2021)   Overall Financial Resource Strain (CARDIA)    Difficulty of Paying Living Expenses: Not hard at all  Food Insecurity: No Food Insecurity (10/21/2021)   Hunger Vital Sign    Worried About Running Out of Food in the Last Year: Never true    [place] in the Last Year: Never true  Transportation Needs: No Transportation Needs (10/21/2021)   PRAPARE - Hydrologist (Medical): No    Lack of Transportation (Non-Medical): No  Physical Activity: Inactive (10/21/2021)   Exercise Vital Sign    Days of Exercise per Week: 0 days    Minutes of Exercise per Session: 0 min  Stress: No Stress  Concern Present (10/21/2021)   [place]    Feeling of Stress : Not at all  Social Connections: Moderately Integrated (10/21/2021)   Social Connection and Isolation Panel [NHANES]    Frequency of Communication with Friends and Family: More than three times a week    Frequency of Social Gatherings with Friends and Family: More than three times a week    Attends Religious Services: More than 4 times per year    Active Member of [employer] or Organizations: No    Attends Archivist Meetings: Never    Marital Status: Married  Human resources officer Violence: Not At Risk (10/21/2021)   Humiliation, Afraid, Rape, and Kick questionnaire    Fear of Current or Ex-Partner: No    Emotionally Abused: No    Physically Abused: No    Sexually Abused: No    Past  Surgical History:  Procedure Laterality Date   ABDOMINAL HYSTERECTOMY     partial   BREAST SURGERY Bilateral    implants   rotator  cuff Right    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   Procedure: RIGHT TOTAL KNEE ARTHROPLASTY;  Surgeon: Paralee Cancel, MD;  Location: [facility];  Service: Orthopedics;  Laterality: Right;   TOTAL SHOULDER ARTHROPLASTY      Family History  Problem Relation Age of Onset   Heart disease Mother    Diabetes Mother    Migraines Mother    Diabetes Father    Non-Hodgkin's lymphoma Father    Urticaria Father    Urticaria Sister    Breast cancer Sister    Asthma Brother    Urticaria Brother    Angioedema Brother    Bladder Cancer Brother    Eczema Maternal Grandmother    Allergic rhinitis Daughter    Lupus Daughter    Eczema Daughter    Heart disease Brother     Allergies  Allergen Reactions   Codeine Itching and Nausea Only    Current Outpatient Medications on File Prior to Visit  Medication Sig Dispense Refill   Biotin 10 MG TABS Take by mouth.     Cholecalciferol (VITAMIN D3) 2000 units TABS Take 2,000 Units by mouth daily.     DULoxetine (CYMBALTA) 30 MG capsule Take 1 capsule (30 mg total) by mouth daily. 90 capsule 1   furosemide (LASIX) 20 MG tablet 1 tablet by mouth once daily as needed for edema 30 tablet 3   GLUCOSAMINE-CHONDROITIN PO Take 1 tablet by mouth 2 (two) times daily.     HYDROcodone-acetaminophen (NORCO/VICODIN) 5-325 MG tablet Take one or two daily, not to exceed 45/month 45 tablet 0   losartan (COZAAR) 25 MG tablet Take 1 tablet (25 mg total) by mouth daily. 90 tablet 1   magnesium oxide (MAG-OX) 400 MG tablet Take 400 mg by mouth daily.     omeprazole (PRILOSEC) 40 MG capsule Take 40 mg by mouth every evening.   0   rosuvastatin (CRESTOR) 20 MG tablet Take 1 tablet (20 mg total) by mouth daily. 90 tablet 1   Semaglutide, 1 MG/DOSE, (OZEMPIC, 1 MG/DOSE,) 4 MG/3ML SOPN Ozempic 1 mg once a week 3 mL 1   terbinafine (LAMISIL)  250 MG tablet Take 1 tablet (250 mg total) by mouth daily. 30 tablet 0   triamterene-hydrochlorothiazide (MAXZIDE-25) 37.5-25 MG tablet Take 0.5 tablets by mouth daily.      Turmeric 500 MG TABS Take by mouth.     zolpidem (AMBIEN) 5 MG  tablet Take 1 tablet (5 mg total) by mouth at bedtime. 30 tablet 2   No current facility-administered medications on file prior to visit.    BP 114/70   Pulse 98   Temp 98 F (36.7 C)   Resp 18   Ht '5\' 8"'$  (1.727 m)   Wt 173 lb (78.5 kg)   SpO2 96%   BMI 26.30 kg/m        Objective:   Physical Exam  General- No acute distress. Pleasant patient.  Lungs- Clear, even and unlabored. Heart- regular rate and rhythm. Neurologic- CNII- XII grossly intact.  Skin- left lower side back/lower cva area raised pink red area about 10 cm wide. Warm, tender but not fluctuant.      Assessment & Plan:   Patient Instructions  Skin infection- infected sebaceous cyst or early abscess. Doxycycline(rx advisement). Continue warm compresses.  If area expands or worsens as discussed then be seen in ED or UC.  Give my chart up Tuesday or Wednesday then decide on follow up in our office.  Keep dermatologist appointment end of January.   Mackie Pai PA-C

## 2022-03-12 ENCOUNTER — Encounter: Payer: Self-pay | Admitting: Medical

## 2022-03-12 ENCOUNTER — Other Ambulatory Visit: Payer: Self-pay | Admitting: Family

## 2022-03-28 ENCOUNTER — Ambulatory Visit: Payer: Medicare Other | Admitting: Family

## 2022-04-01 ENCOUNTER — Other Ambulatory Visit: Payer: Self-pay

## 2022-04-01 ENCOUNTER — Telehealth: Payer: Self-pay | Admitting: Family

## 2022-04-01 DIAGNOSIS — E785 Hyperlipidemia, unspecified: Secondary | ICD-10-CM

## 2022-04-01 MED ORDER — OMEPRAZOLE 40 MG PO CPDR: 40.0000 mg | DELAYED_RELEASE_CAPSULE | Freq: Every evening | ORAL | 1 refills | Status: DC

## 2022-04-01 MED ORDER — TRIAMTERENE-HCTZ 37.5-25 MG PO TABS: 0.5000 | ORAL_TABLET | Freq: Every day | ORAL | 1 refills | Status: DC

## 2022-04-01 MED ORDER — ROSUVASTATIN CALCIUM 20 MG PO TABS: 20.0000 mg | ORAL_TABLET | Freq: Every day | ORAL | 1 refills | Status: DC

## 2022-04-01 NOTE — Telephone Encounter (Signed)
Medication: (90 day supply) omeprazole (PRILOSEC) 40 MG capsule  rosuvastatin (CRESTOR) 20 MG tablet  triamterene-hydrochlorothiazide (MAXZIDE-25) 37.5-25 MG tablet  Has the patient contacted their pharmacy? No.   Preferred Pharmacy:  [facility] [place], [place] [address] [facility] [address] [place], [place] [address] Phone: [phone]  Fax: [phone]

## 2022-04-01 NOTE — Telephone Encounter (Signed)
Prescriptions sent to [facility] as requested

## 2022-04-14 ENCOUNTER — Other Ambulatory Visit: Payer: Self-pay

## 2022-04-14 ENCOUNTER — Telehealth: Payer: Self-pay | Admitting: Family

## 2022-04-14 ENCOUNTER — Other Ambulatory Visit: Payer: Self-pay | Admitting: Family

## 2022-04-14 DIAGNOSIS — E785 Hyperlipidemia, unspecified: Secondary | ICD-10-CM

## 2022-04-14 MED ORDER — ROSUVASTATIN CALCIUM 20 MG PO TABS: 20.0000 mg | ORAL_TABLET | Freq: Every day | ORAL | 1 refills | Status: DC

## 2022-04-14 NOTE — Telephone Encounter (Signed)
Pt has been out of rx and is needing this as soon as possible. She stated every time she needs an rx there is problem and she would like this fixed.   rosuvastatin (CRESTOR) 20 MG tablet   [facility] - [place], [place] - [address] [facility] [address] [place], [place] [address] Phone: [phone]  Fax: [phone]

## 2022-04-14 NOTE — Telephone Encounter (Signed)
[facility] as requested

## 2022-04-14 NOTE — Telephone Encounter (Signed)
Requesting: zolpidem '5mg'$  Contract: yes UDS: 08/12/21 Last Visit: 12/31/2021 Next Visit: 04/14/2022 Last Refill: 03/12/22  Please Advise

## 2022-04-24 ENCOUNTER — Telehealth: Payer: Self-pay | Admitting: Family

## 2022-04-24 NOTE — Telephone Encounter (Signed)
Pt called stating that [facility] is unable to fill her rosuvastatin due to supply and she needed to have a 30 day supply sent into the following pharmacy until [facility] can get that to her:  [place], [place] - [address] [place] [address] Phone: [phone]  Fax: [phone]

## 2022-04-25 ENCOUNTER — Other Ambulatory Visit: Payer: Self-pay

## 2022-04-25 DIAGNOSIS — E785 Hyperlipidemia, unspecified: Secondary | ICD-10-CM

## 2022-04-25 MED ORDER — ROSUVASTATIN CALCIUM 20 MG PO TABS: 20.0000 mg | ORAL_TABLET | Freq: Every day | ORAL | 0 refills | Status: DC

## 2022-04-25 NOTE — Telephone Encounter (Signed)
30 days supply sent to [facility]

## 2022-05-05 NOTE — Progress Notes (Signed)
Subjective:   By signing my name below, I, Marlana Latus, attest that this documentation has been prepared under the direction and in the presence of Nance Pear, NP 05/06/22   Patient ID: Maria Brennan, female    DOB: January 19, 1946, 77 y.o.   MRN: [id number]  Chief Complaint  Patient presents with   Diabetes    Here for follow up   Dysuria    Patient complains of urinary burning x 3 days    HPI Patient is in today for a 4 month follow up.   UTI: Burning for about 3 days.   Toe nail: Saw her podiatrist, Dr. Delora Fuel recently. She reports that her toe nail has improved since her prior visit. She is compliant with Lamisil  Squamous cell carcinoma: She reports she was diagnosed with squamous cell carcinoma. She endorses redness, tenderness, and a scab on her right anterior right lower extremity. She reports using a topical chemo cream.   Arthritis: She complains of flare ups. She takes 2 arthritis Tylenol pills. She also uses hydrocortisone cream as well.   Vision: She has a routine vision exam scheduled next month.   Ozempic: She is compliant with her Ozempic. She reports she has lost 60 lbs.   Reflux: She reports she occasionally has reflux, but not often.  Cholesterol: She is compliant with her Crestor 20 mg daily.   Mood: She reports her mood on Cymbalta has been fine.  Sleep: She has been taking Ambien, 5 mg at bedtime. She reports that lately she has not been able to sleep through the night, which she attributes to back pain.    Past Medical History:  Diagnosis Date   Arthritis    oa   Diabetes mellitus without complication (HCC)    Family history of adverse reaction to anesthesia    mother and sister slow to awaken    GERD (gastroesophageal reflux disease)    History of hiatal hernia    Hypertension    Insomnia    Melanoma ([place])    followed at [place].   MVP (mitral valve prolapse)    Osteopenia    PONV (postoperative nausea and vomiting) yrs ago    SLE (systemic lupus erythematosus related syndrome) ([place])     Past Surgical History:  Procedure Laterality Date   ABDOMINAL HYSTERECTOMY     partial   BREAST SURGERY Bilateral    implants   rotator  cuff Right    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   Procedure: RIGHT TOTAL KNEE ARTHROPLASTY;  Surgeon: Paralee Cancel, MD;  Location: [facility];  Service: Orthopedics;  Laterality: Right;   TOTAL SHOULDER ARTHROPLASTY      Family History  Problem Relation Age of Onset   Heart disease Mother    Diabetes Mother    Migraines Mother    Diabetes Father    Non-Hodgkin's lymphoma Father    Urticaria Father    Urticaria Sister    Breast cancer Sister    Asthma Brother    Urticaria Brother    Angioedema Brother    Bladder Cancer Brother    Eczema Maternal Grandmother    Allergic rhinitis Daughter    Lupus Daughter    Eczema Daughter    Heart disease Brother     Social History   Socioeconomic History   Marital status: Married    Spouse name: Not on file   Number of children: Not on file   Years of education: Not on file  Highest education level: Not on file  Occupational History   Not on file  Tobacco Use   Smoking status: Former    Packs/day: 0.50    Years: 53.00    Total pack years: 26.50    Types: Cigarettes   Smokeless tobacco: Never  Vaping Use   Vaping Use: Never used  Substance and Sexual Activity   Alcohol use: No   Drug use: No   Sexual activity: Not on file  Other Topics Concern   Not on file  Social History Narrative   Married, husband is a English as a second language teacher and is disabled   2 daughters, one local and one in [place]    4 grandchildren grown   Retired from Probation officer   Has one dog   Enjoys Haematologist   Spending time with children   Friends with Catarina Hartshorn   Social Determinants of Health   Financial Resource Strain: Low Risk  (10/21/2021)   Overall Financial Resource Strain (CARDIA)    Difficulty of Paying Living Expenses: Not hard  at all  Food Insecurity: No Food Insecurity (10/21/2021)   Hunger Vital Sign    Worried About Running Out of Food in the Last Year: Never true    [place] in the Last Year: Never true  Transportation Needs: No Transportation Needs (10/21/2021)   PRAPARE - Hydrologist (Medical): No    Lack of Transportation (Non-Medical): No  Physical Activity: Inactive (10/21/2021)   Exercise Vital Sign    Days of Exercise per Week: 0 days    Minutes of Exercise per Session: 0 min  Stress: No Stress Concern Present (10/21/2021)   [place]    Feeling of Stress : Not at all  Social Connections: Moderately Integrated (10/21/2021)   Social Connection and Isolation Panel [NHANES]    Frequency of Communication with Friends and Family: More than three times a week    Frequency of Social Gatherings with Friends and Family: More than three times a week    Attends Religious Services: More than 4 times per year    Active Member of [employer] or Organizations: No    Attends Archivist Meetings: Never    Marital Status: Married  Human resources officer Violence: Not At Risk (10/21/2021)   Humiliation, Afraid, Rape, and Kick questionnaire    Fear of Current or Ex-Partner: No    Emotionally Abused: No    Physically Abused: No    Sexually Abused: No    Outpatient Medications Prior to Visit  Medication Sig Dispense Refill   Biotin 10 MG TABS Take by mouth.     Cholecalciferol (VITAMIN D3) 2000 units TABS Take 2,000 Units by mouth daily.     DULoxetine (CYMBALTA) 30 MG capsule Take 1 capsule (30 mg total) by mouth daily. 90 capsule 1   furosemide (LASIX) 20 MG tablet 1 tablet by mouth once daily as needed for edema 30 tablet 3   GLUCOSAMINE-CHONDROITIN PO Take 1 tablet by mouth 2 (two) times daily.     HYDROcodone-acetaminophen (NORCO/VICODIN) 5-325 MG tablet Take one or two daily, not to exceed 45/month 45 tablet 0    losartan (COZAAR) 25 MG tablet Take 1 tablet (25 mg total) by mouth daily. 90 tablet 1   magnesium oxide (MAG-OX) 400 MG tablet Take 400 mg by mouth daily.     MAGNESIUM PO Take by mouth.     omeprazole (PRILOSEC) 40 MG capsule  Take 1 capsule (40 mg total) by mouth every evening. 90 capsule 1   triamterene-hydrochlorothiazide (MAXZIDE-25) 37.5-25 MG tablet Take 0.5 tablets by mouth daily. 45 tablet 1   Turmeric 500 MG TABS Take by mouth.     rosuvastatin (CRESTOR) 20 MG tablet Take 1 tablet (20 mg total) by mouth daily. 30 tablet 0   Semaglutide, 1 MG/DOSE, (OZEMPIC, 1 MG/DOSE,) 4 MG/3ML SOPN Ozempic 1 mg once a week 3 mL 1   zolpidem (AMBIEN) 5 MG tablet TAKE 1 TABLET BY MOUTH AT BEDTIME 30 tablet 0   doxycycline (VIBRA-TABS) 100 MG tablet Take 1 tablet (100 mg total) by mouth 2 (two) times daily. Can give caps or generic. 20 tablet 0   terbinafine (LAMISIL) 250 MG tablet Take 1 tablet (250 mg total) by mouth daily. 30 tablet 0   No facility-administered medications prior to visit.    Allergies  Allergen Reactions   Codeine Itching and Nausea Only    Review of Systems  Gastrointestinal:  Positive for heartburn.  Musculoskeletal:  Positive for joint pain.  Skin:        (+) redness, tenderness, and a scab on her her anterior right lower extremity -- attributed to squamous cell carcinoma  Psychiatric/Behavioral:  The patient has insomnia.        Objective:    Physical Exam Musculoskeletal:     Comments: Scab right shin with mild erythema     BP (!) 124/59 (BP Location: Right Arm, Patient Position: Sitting, Cuff Size: Small)   Pulse 86   Temp 97.7 F (36.5 C) (Oral)   Resp 16   Wt 180 lb (81.6 kg)   SpO2 96%   BMI 27.37 kg/m  Wt Readings from Last 3 Encounters:  05/06/22 180 lb (81.6 kg)  03/07/22 173 lb (78.5 kg)  12/31/21 183 lb (83 kg)       Assessment & Plan:  Diabetes mellitus type 2 without retinopathy ([place]) Assessment & Plan: Lab Results  Component  Value Date   HGBA1C 6.7 (H) 12/31/2021   HGBA1C 6.4 08/12/2021   HGBA1C 6.0 (H) 11/09/2015   Lab Results  Component Value Date   MICROALBUR 1.7 08/12/2021   LDLCALC 83 09/12/2021   CREATININE 0.78 12/31/2021   Wt Readings from Last 3 Encounters:  05/06/22 180 lb (81.6 kg)  03/07/22 173 lb (78.5 kg)  12/31/21 183 lb (83 kg)   Continues ozempic.    Orders: -     Hemoglobin A1c -     Ozempic (1 MG/DOSE); Ozempic 1 mg once a week  Dispense: 3 mL; Refill: 1 -     Basic metabolic panel  Gastroesophageal reflux disease, unspecified whether esophagitis present Assessment & Plan: Stable on omeprazole. Continue same.    Hyperlipidemia, unspecified hyperlipidemia type Assessment & Plan: Lab Results  Component Value Date   CHOL 181 09/12/2021   HDL 75.10 09/12/2021   LDLCALC 83 09/12/2021   TRIG 114.0 09/12/2021   CHOLHDL 2 09/12/2021   Maintained on crestor '20mg'$ .   Orders: -     Rosuvastatin Calcium; Take 1 tablet (20 mg total) by mouth daily.  Dispense: 90 tablet; Refill: 1  Iron deficiency Assessment & Plan: Lab Results  Component Value Date   WBC 5.3 08/12/2021   HGB 12.8 08/12/2021   HCT 37.6 08/12/2021   MCV 91.8 08/12/2021   PLT 247.0 08/12/2021      Skin cancer Assessment & Plan: Had squamous cell carcinoma on right shin, back.  Following with [facility].  Had cellulitis right shin and was treated with keflex and then bactrim.     Cellulitis of right lower extremity -     Sulfamethoxazole-Trimethoprim; Take 1 tablet by mouth 2 (two) times daily.  Dispense: 14 tablet; Refill: 0  Obstructive sleep apnea treated with continuous positive airway pressure (CPAP) Assessment & Plan: Stable on cpap, management per Dr. Halford Chessman.    Onychomycosis Assessment & Plan: Notes improvement in new toenail growth.    Essential hypertension Assessment & Plan: BP Readings from Last 3 Encounters:  05/06/22 (!) 124/59  03/07/22 114/70  12/31/21 116/62   BP  stable on maxzide/losartan.     Recurrent major depressive disorder, in partial remission [facility]) Assessment & Plan: Reports that her mood is stable on cymbalta.   SLE (systemic lupus erythematosus related syndrome) ([place]) Assessment & Plan: Management per rheumatology.    Other insomnia Assessment & Plan: Stable with use of ambien. Controlled substance contract is updated today.   Orders: -     Zolpidem Tartrate; Take 1 tablet (5 mg total) by mouth at bedtime.  Dispense: 30 tablet; Refill: 0  Dysuria -     POCT Urinalysis Dipstick (Automated) -     Urine Culture  New. Urine dip unremarkable. Will await urine culture prior to initiating antibiotic rx.   I,Rachel Rivera,acting as a Education administrator for Nance Pear, NP.,have documented all relevant documentation on the behalf of Nance Pear, NP,as directed by  Nance Pear, NP while in the presence of Nance Pear, NP.   I, Nance Pear, NP, personally preformed the services described in this documentation.  All medical record entries made by the scribe were at my direction and in my presence.  I have reviewed the chart and discharge instructions (if applicable) and agree that the record reflects my personal performance and is accurate and complete. 05/06/22   Nance Pear, NP

## 2022-05-06 ENCOUNTER — Ambulatory Visit (INDEPENDENT_AMBULATORY_CARE_PROVIDER_SITE_OTHER): Payer: Medicare Other | Admitting: Family

## 2022-05-06 VITALS — BP 124/59 | HR 86 | Temp 97.7°F | Resp 16 | Wt 180.0 lb

## 2022-05-06 DIAGNOSIS — E611 Iron deficiency: Secondary | ICD-10-CM | POA: Diagnosis not present

## 2022-05-06 DIAGNOSIS — K219 Gastro-esophageal reflux disease without esophagitis: Secondary | ICD-10-CM

## 2022-05-06 DIAGNOSIS — E785 Hyperlipidemia, unspecified: Secondary | ICD-10-CM | POA: Diagnosis not present

## 2022-05-06 DIAGNOSIS — F3341 Major depressive disorder, recurrent, in partial remission: Secondary | ICD-10-CM

## 2022-05-06 DIAGNOSIS — E119 Type 2 diabetes mellitus without complications: Secondary | ICD-10-CM

## 2022-05-06 DIAGNOSIS — G4733 Obstructive sleep apnea (adult) (pediatric): Secondary | ICD-10-CM

## 2022-05-06 DIAGNOSIS — C4490 Unspecified malignant neoplasm of skin, unspecified: Secondary | ICD-10-CM | POA: Insufficient documentation

## 2022-05-06 DIAGNOSIS — R300 Dysuria: Secondary | ICD-10-CM

## 2022-05-06 DIAGNOSIS — M329 Systemic lupus erythematosus, unspecified: Secondary | ICD-10-CM

## 2022-05-06 DIAGNOSIS — I10 Essential (primary) hypertension: Secondary | ICD-10-CM

## 2022-05-06 DIAGNOSIS — B351 Tinea unguium: Secondary | ICD-10-CM

## 2022-05-06 DIAGNOSIS — L03115 Cellulitis of right lower limb: Secondary | ICD-10-CM

## 2022-05-06 DIAGNOSIS — G4709 Other insomnia: Secondary | ICD-10-CM

## 2022-05-06 LAB — POC URINALSYSI DIPSTICK (AUTOMATED)
Bilirubin, UA: NEGATIVE
Blood, UA: NEGATIVE
Glucose, UA: NEGATIVE
Leukocytes, UA: NEGATIVE
Nitrite, UA: NEGATIVE
Protein, UA: NEGATIVE
Spec Grav, UA: 1.015 (ref 1.010–1.025)
Urobilinogen, UA: 0.2 E.U./dL
pH, UA: 6 (ref 5.0–8.0)

## 2022-05-06 MED ORDER — ROSUVASTATIN CALCIUM 20 MG PO TABS: 20.0000 mg | ORAL_TABLET | Freq: Every day | ORAL | 1 refills | Status: DC

## 2022-05-06 MED ORDER — OZEMPIC (1 MG/DOSE) 4 MG/3ML ~~LOC~~ SOPN: PEN_INJECTOR | SUBCUTANEOUS | 1 refills | Status: DC

## 2022-05-06 MED ORDER — SULFAMETHOXAZOLE-TRIMETHOPRIM 800-160 MG PO TABS: 1.0000 | ORAL_TABLET | Freq: Two times a day (BID) | ORAL | 0 refills | Status: AC

## 2022-05-06 MED ORDER — ZOLPIDEM TARTRATE 5 MG PO TABS: 5.0000 mg | ORAL_TABLET | Freq: Every day | ORAL | 0 refills | Status: DC

## 2022-05-06 NOTE — Assessment & Plan Note (Signed)
Lab Results  Component Value Date   HGBA1C 6.7 (H) 12/31/2021   HGBA1C 6.4 08/12/2021   HGBA1C 6.0 (H) 11/09/2015   Lab Results  Component Value Date   MICROALBUR 1.7 08/12/2021   LDLCALC 83 09/12/2021   CREATININE 0.78 12/31/2021   Wt Readings from Last 3 Encounters:  05/06/22 180 lb (81.6 kg)  03/07/22 173 lb (78.5 kg)  12/31/21 183 lb (83 kg)   Continues ozempic.

## 2022-05-06 NOTE — Assessment & Plan Note (Signed)
Stable on omeprazole. Continue same.  ?

## 2022-05-06 NOTE — Assessment & Plan Note (Signed)
BP Readings from Last 3 Encounters:  05/06/22 (!) 124/59  03/07/22 114/70  12/31/21 116/62   BP stable on maxzide/losartan.

## 2022-05-06 NOTE — Assessment & Plan Note (Signed)
Management per rheumatology.

## 2022-05-06 NOTE — Assessment & Plan Note (Signed)
Notes improvement in new toenail growth.

## 2022-05-06 NOTE — Assessment & Plan Note (Addendum)
Reports that her mood is stable on cymbalta.

## 2022-05-06 NOTE — Assessment & Plan Note (Signed)
Had squamous cell carcinoma on right shin, back.  Following with [facility].  Had cellulitis right shin and was treated with keflex and then bactrim.

## 2022-05-06 NOTE — Assessment & Plan Note (Addendum)
Stable with use of ambien. Controlled substance contract is updated today.

## 2022-05-06 NOTE — Assessment & Plan Note (Signed)
Lab Results  Component Value Date   CHOL 181 09/12/2021   HDL 75.10 09/12/2021   LDLCALC 83 09/12/2021   TRIG 114.0 09/12/2021   CHOLHDL 2 09/12/2021   Maintained on crestor '20mg'$ .

## 2022-05-06 NOTE — Assessment & Plan Note (Signed)
Lab Results  Component Value Date   WBC 5.3 08/12/2021   HGB 12.8 08/12/2021   HCT 37.6 08/12/2021   MCV 91.8 08/12/2021   PLT 247.0 08/12/2021

## 2022-05-06 NOTE — Assessment & Plan Note (Signed)
Stable on cpap, management per Dr. Halford Chessman.

## 2022-05-06 NOTE — Addendum Note (Signed)
Addended by: Kem Boroughs D on: 05/06/2022 04:33 PM   Modules accepted: Orders

## 2022-05-07 ENCOUNTER — Telehealth: Payer: Self-pay | Admitting: Family

## 2022-05-07 LAB — BASIC METABOLIC PANEL
BUN: 20 mg/dL (ref 6–23)
CO2: 31 mEq/L (ref 19–32)
Calcium: 10 mg/dL (ref 8.4–10.5)
Chloride: 96 mEq/L (ref 96–112)
Creatinine, Ser: 0.8 mg/dL (ref 0.40–1.20)
GFR: 72.13 mL/min (ref >60.00)
Glucose, Bld: 177 mg/dL — ABNORMAL HIGH (ref 70–99)
Potassium: 3.5 mEq/L (ref 3.5–5.1)
Sodium: 137 mEq/L (ref 135–145)

## 2022-05-07 LAB — URINE CULTURE
MICRO NUMBER:: 14620546
SPECIMEN QUALITY:: ADEQUATE

## 2022-05-07 LAB — HEMOGLOBIN A1C: Hgb A1c MFr Bld: 6.2 % (ref 4.6–6.5)

## 2022-05-07 MED ORDER — OZEMPIC (1 MG/DOSE) 4 MG/3ML ~~LOC~~ SOPN: PEN_INJECTOR | SUBCUTANEOUS | 1 refills | Status: DC

## 2022-05-07 MED ORDER — ROSUVASTATIN CALCIUM 20 MG PO TABS: 20.0000 mg | ORAL_TABLET | Freq: Every day | ORAL | 1 refills | Status: AC

## 2022-05-07 NOTE — Telephone Encounter (Signed)
Urine culture is negative for bacteria.  Pt can d/c bactrim.

## 2022-05-07 NOTE — Addendum Note (Signed)
Addended by: Debbrah Alar on: 05/07/2022 07:20 AM   Modules accepted: Orders

## 2022-05-08 NOTE — Telephone Encounter (Signed)
Patient advised of results and provider's comments.  

## 2022-06-10 ENCOUNTER — Other Ambulatory Visit: Payer: Self-pay | Admitting: Family

## 2022-06-10 DIAGNOSIS — G4709 Other insomnia: Secondary | ICD-10-CM

## 2022-06-20 ENCOUNTER — Telehealth: Payer: Self-pay | Admitting: Family

## 2022-06-20 DIAGNOSIS — E119 Type 2 diabetes mellitus without complications: Secondary | ICD-10-CM

## 2022-06-20 NOTE — Telephone Encounter (Signed)
Prescription Request  06/20/2022  Is this a "Controlled Substance" medicine? No  LOV: 05/06/2022  What is the name of the medication or equipment?  Semaglutide, 1 MG/DOSE, (OZEMPIC, 1 MG/DOSE,) 4 MG/3ML SOPN   Have you contacted your pharmacy to request a refill? No   Which pharmacy would you like this sent to?  [facility] - [place], [place] - [address] [facility] [address] [place] [address] Phone: [phone] Fax: [phone]   Patient notified that their request is being sent to the clinical staff for review and that they should receive a response within 2 business days.   Please advise at Mobile [phone] (mobile)

## 2022-06-23 MED ORDER — OZEMPIC (1 MG/DOSE) 4 MG/3ML ~~LOC~~ SOPN
PEN_INJECTOR | SUBCUTANEOUS | 1 refills | Status: DC
Start: 2022-06-23 — End: 2022-08-01

## 2022-06-23 NOTE — Telephone Encounter (Signed)
Rx sent 

## 2022-06-23 NOTE — Addendum Note (Signed)
Addended by: Wilford Corner on: 06/23/2022 02:31 PM   Modules accepted: Orders

## 2022-06-24 ENCOUNTER — Ambulatory Visit: Admitting: Family

## 2022-07-07 ENCOUNTER — Other Ambulatory Visit: Payer: Self-pay | Admitting: Family

## 2022-07-07 DIAGNOSIS — G4709 Other insomnia: Secondary | ICD-10-CM

## 2022-07-09 NOTE — Telephone Encounter (Signed)
Prescription Request  07/09/2022  Is this a "Controlled Substance" medicine? Yes  LOV: Visit date not found  What is the name of the medication or equipment?  zolpidem (AMBIEN) 5 MG tablet   Have you contacted your pharmacy to request a refill? Yes   Which pharmacy would you like this sent to?   [facility] - [place], [place] - [address] [place] [address] Phone: [phone] Fax: [phone]    Patient notified that their request is being sent to the clinical staff for review and that they should receive a response within 2 business days.   Please advise at [facility] [phone]

## 2022-07-10 ENCOUNTER — Encounter: Payer: Self-pay | Admitting: Family

## 2022-07-10 ENCOUNTER — Telehealth: Payer: Self-pay | Admitting: Family

## 2022-07-10 DIAGNOSIS — G4709 Other insomnia: Secondary | ICD-10-CM

## 2022-07-10 MED ORDER — ZOLPIDEM TARTRATE 5 MG PO TABS: 5.0000 mg | ORAL_TABLET | Freq: Every day | ORAL | 2 refills | Status: DC

## 2022-07-10 NOTE — Addendum Note (Signed)
Addended by: Sandford Craze on: 07/10/2022 11:46 AM   Modules accepted: Orders

## 2022-07-10 NOTE — Telephone Encounter (Signed)
Refill [facility] to [facility]- I placed 2 refills on rx.

## 2022-07-10 NOTE — Telephone Encounter (Signed)
Last RX: 06/11/2022 Last OV: 05/06/22 Next OV:11/04/22 UDS: 08/12/2021 CSC: 05/06/22  Patient reports she called yesterday, I do not see a message from yesterday. Patient was notified for ambien refills, she has to request monthly and the easiest way is to leave a message like she did today or send a Clinical cytogeneticist message. Suggested she does this 5 to 7 days before running out of the medication.

## 2022-07-10 NOTE — Telephone Encounter (Signed)
Pt said she is frustrated because she feels like she has to beg for the prescription every month and call multiple times. She does not know what the hang up is but there is always an issue. Pt is even searching for another provider due to this. Please call to advise.    Prescription Request  07/10/2022  Is this a "Controlled Substance" medicine? Yes  LOV: 05/06/2022  What is the name of the medication or equipment?  zolpidem (AMBIEN) 5 MG tablet   Have you contacted your pharmacy to request a refill? Yes   Which pharmacy would you like this sent to?   [facility] - [place], [place] - [address] [place] [address] Phone: [phone] Fax: [phone]    Patient notified that their request is being sent to the clinical staff for review and that they should receive a response within 2 business days.   Please advise at Mobile [phone] (mobile)

## 2022-07-10 NOTE — Telephone Encounter (Signed)
Patient notified

## 2022-07-29 ENCOUNTER — Telehealth: Payer: Self-pay | Admitting: Family

## 2022-07-29 NOTE — Telephone Encounter (Signed)
Prescription Request  07/29/2022  Is this a "Controlled Substance" medicine? No  LOV: 05/06/2022  What is the name of the medication or equipment?  triamterene-hydrochlorothiazide (MAXZIDE-25) 37.5-25 MG tablet   Have you contacted your pharmacy to request a refill? Yes   Which pharmacy would you like this sent to?  [facility] - [place], [place] - [address] [facility] [address] [place] [address] Phone: [phone] Fax: [phone]    Patient notified that their request is being sent to the clinical staff for review and that they should receive a response within 2 business days.   Please advise at Mobile [phone] (mobile)

## 2022-07-30 ENCOUNTER — Other Ambulatory Visit: Payer: Self-pay

## 2022-07-30 MED ORDER — TRIAMTERENE-HCTZ 37.5-25 MG PO TABS: 0.5000 | ORAL_TABLET | Freq: Every day | ORAL | 1 refills | Status: AC

## 2022-07-30 NOTE — Telephone Encounter (Signed)
Rx sent 

## 2022-08-01 ENCOUNTER — Telehealth: Payer: Self-pay | Admitting: Family

## 2022-08-01 DIAGNOSIS — E119 Type 2 diabetes mellitus without complications: Secondary | ICD-10-CM

## 2022-08-01 MED ORDER — OZEMPIC (1 MG/DOSE) 4 MG/3ML ~~LOC~~ SOPN
PEN_INJECTOR | SUBCUTANEOUS | 1 refills | Status: DC
Start: 2022-08-01 — End: 2022-09-15

## 2022-08-01 NOTE — Telephone Encounter (Signed)
Prescription Request  08/01/2022  Is this a "Controlled Substance" medicine? No  LOV: 05/06/2022  What is the name of the medication or equipment?   Semaglutide, 1 MG/DOSE, (OZEMPIC, 1 MG/DOSE,) 4 MG/3ML SOPN [[id number]]   Have you contacted your pharmacy to request a refill? No   Which pharmacy would you like this sent to?   [facility] - [place], [place] - [address] [facility] [address] [place] [address] Phone: [phone] Fax: [phone]  Patient notified that their request is being sent to the  clinical staff for review and that they should receive a response within 2 business days.   Please advise at Mobile [phone] (mobile)

## 2022-08-01 NOTE — Addendum Note (Signed)
Addended by: Sandford Craze on: 08/01/2022 11:49 AM   Modules accepted: Orders

## 2022-08-11 ENCOUNTER — Telehealth: Payer: Self-pay | Admitting: Family

## 2022-08-11 ENCOUNTER — Other Ambulatory Visit: Payer: Self-pay

## 2022-08-11 MED ORDER — LOSARTAN POTASSIUM 25 MG PO TABS: 25.0000 mg | ORAL_TABLET | Freq: Every day | ORAL | 1 refills | Status: AC

## 2022-08-11 NOTE — Telephone Encounter (Signed)
Prescription sent

## 2022-08-11 NOTE — Telephone Encounter (Signed)
Prescription Request  08/11/2022  Is this a "Controlled Substance" medicine? No  LOV: 05/06/2022  What is the name of the medication or equipment? losartan (COZAAR) 25 MG tablet   DULoxetine (CYMBALTA) 30 MG capsule   Have you contacted your pharmacy to request a refill? No   Which pharmacy would you like this sent to?  [facility] - [place], [place] - [address] [facility] [address] [place] [address] Phone: [phone] Fax: [phone]     Patient notified that their request is being sent to the clinical staff for review and that they should receive a response within 2 business days.   Please advise at Mobile [phone] (mobile)

## 2022-08-21 ENCOUNTER — Telehealth: Payer: Self-pay | Admitting: Family

## 2022-08-21 NOTE — Telephone Encounter (Signed)
Pt's Duloxetine 30 mg is in transit to be shipped but it'll take about 10 days to get to her in the mail so patient wants to know if Maria Brennan can call 10 pills in for her to [facility] in [place]. Please call pt to confirm.

## 2022-08-22 ENCOUNTER — Other Ambulatory Visit: Payer: Self-pay | Admitting: Family

## 2022-08-22 MED ORDER — DULOXETINE HCL 30 MG PO CPEP: 30.0000 mg | ORAL_CAPSULE | Freq: Every day | ORAL | 1 refills | Status: AC

## 2022-08-22 NOTE — Telephone Encounter (Signed)
Patient called and would like a status on medication. Efraim Kaufmann is out of office

## 2022-08-24 ENCOUNTER — Encounter: Payer: Self-pay | Admitting: Family

## 2022-08-24 NOTE — Telephone Encounter (Signed)
Rx was sent by Worthy Rancher NP

## 2022-08-25 ENCOUNTER — Telehealth: Payer: Self-pay

## 2022-08-25 NOTE — Telephone Encounter (Signed)
Pt states that her cymbalta mail order will not get to her in time before she runs out and asking if Efraim Kaufmann will send a 10 day supply to the [facility] in [place].

## 2022-08-26 MED ORDER — DULOXETINE HCL 30 MG PO CPEP: 30.0000 mg | ORAL_CAPSULE | Freq: Every day | ORAL | 0 refills | Status: AC

## 2022-08-26 NOTE — Telephone Encounter (Signed)
Temporary supply sent for #10 capsules

## 2022-08-26 NOTE — Addendum Note (Signed)
Addended by: Wilford Corner on: 08/26/2022 10:36 AM   Modules accepted: Orders

## 2022-09-15 ENCOUNTER — Telehealth: Payer: Self-pay | Admitting: Family

## 2022-09-15 DIAGNOSIS — E119 Type 2 diabetes mellitus without complications: Secondary | ICD-10-CM

## 2022-09-15 MED ORDER — OZEMPIC (1 MG/DOSE) 4 MG/3ML ~~LOC~~ SOPN
PEN_INJECTOR | SUBCUTANEOUS | 1 refills | Status: AC
Start: 2022-09-15 — End: ?

## 2022-09-15 MED ORDER — OMEPRAZOLE 40 MG PO CPDR: 40.0000 mg | DELAYED_RELEASE_CAPSULE | Freq: Every evening | ORAL | 1 refills | Status: AC

## 2022-09-15 NOTE — Addendum Note (Signed)
Addended by: Thelma Barge D on: 09/15/2022 11:27 AM   Modules accepted: Orders

## 2022-09-15 NOTE — Telephone Encounter (Signed)
Prescription Request  09/15/2022  Is this a "Controlled Substance" medicine? No  LOV: 05/06/2022  What is the name of the medication or equipment? Semaglutide, 1 MG/DOSE, (OZEMPIC, 1 MG/DOSE,) 4 MG/3ML SOPN   omeprazole (PRILOSEC) 40 MG capsule   Have you contacted your pharmacy to request a refill? No   Which pharmacy would you like this sent to?  [facility] - [place], [place] - [address] [facility] [address] [place] [address] Phone: [phone] Fax: [phone]     Patient notified that their request is being sent to the clinical staff for review and that they should receive a response within 2 business days.   Please advise at Mobile [phone] (mobile)

## 2022-09-15 NOTE — Telephone Encounter (Signed)
Pt notified that rx's have been sent in 

## 2022-09-26 ENCOUNTER — Telehealth: Payer: Self-pay | Admitting: Family

## 2022-09-26 DIAGNOSIS — G4709 Other insomnia: Secondary | ICD-10-CM

## 2022-09-26 MED ORDER — ZOLPIDEM TARTRATE 5 MG PO TABS
5.0000 mg | ORAL_TABLET | Freq: Every day | ORAL | 0 refills | Status: AC
Start: 2022-09-26 — End: ?

## 2022-09-26 NOTE — Telephone Encounter (Signed)
Last RX:07/10/22 Last OV:05/06/22 Next OV:10/29/22 UDS:08/12/21 CSC:05/06/22

## 2022-09-26 NOTE — Addendum Note (Signed)
Addended by: Sandford Craze on: 09/26/2022 04:37 PM   Modules accepted: Orders

## 2022-09-26 NOTE — Telephone Encounter (Signed)
Medication: zolpidem (AMBIEN) 5 MG tablet   Has the patient contacted their pharmacy? No.  Preferred Pharmacy:   [facility] [address] [place] - [address] [place] [address] Phone: [phone]  Fax: [phone]

## 2022-10-08 ENCOUNTER — Encounter (INDEPENDENT_AMBULATORY_CARE_PROVIDER_SITE_OTHER): Payer: Self-pay

## 2022-10-28 ENCOUNTER — Emergency Department (HOSPITAL_BASED_OUTPATIENT_CLINIC_OR_DEPARTMENT_OTHER): Payer: Medicare Other

## 2022-10-28 ENCOUNTER — Emergency Department (HOSPITAL_BASED_OUTPATIENT_CLINIC_OR_DEPARTMENT_OTHER)
Admission: EM | Admit: 2022-10-28 | Discharge: 2022-10-28 | Disposition: A | Discharge status: Home / Self Care | Payer: Medicare Other | Attending: Emergency Medicine | Admitting: Emergency Medicine

## 2022-10-28 ENCOUNTER — Encounter (HOSPITAL_BASED_OUTPATIENT_CLINIC_OR_DEPARTMENT_OTHER): Payer: Self-pay | Admitting: Urology

## 2022-10-28 ENCOUNTER — Other Ambulatory Visit: Payer: Self-pay

## 2022-10-28 DIAGNOSIS — S61012A Laceration without foreign body of left thumb without damage to nail, initial encounter: Secondary | ICD-10-CM | POA: Insufficient documentation

## 2022-10-28 DIAGNOSIS — S61215A Laceration without foreign body of left ring finger without damage to nail, initial encounter: Secondary | ICD-10-CM | POA: Insufficient documentation

## 2022-10-28 DIAGNOSIS — S6992XA Unspecified injury of left wrist, hand and finger(s), initial encounter: Secondary | ICD-10-CM | POA: Diagnosis present

## 2022-10-28 DIAGNOSIS — S61211A Laceration without foreign body of left index finger without damage to nail, initial encounter: Secondary | ICD-10-CM | POA: Insufficient documentation

## 2022-10-28 DIAGNOSIS — W293XXA Contact with powered garden and outdoor hand tools and machinery, initial encounter: Secondary | ICD-10-CM | POA: Diagnosis not present

## 2022-10-28 DIAGNOSIS — Z23 Encounter for immunization: Secondary | ICD-10-CM | POA: Diagnosis not present

## 2022-10-28 DIAGNOSIS — S61213A Laceration without foreign body of left middle finger without damage to nail, initial encounter: Secondary | ICD-10-CM | POA: Diagnosis not present

## 2022-10-28 DIAGNOSIS — Z794 Long term (current) use of insulin: Secondary | ICD-10-CM | POA: Insufficient documentation

## 2022-10-28 MED ORDER — FENTANYL CITRATE PF 50 MCG/ML IJ SOSY
50.0000 ug | PREFILLED_SYRINGE | Freq: Once | INTRAMUSCULAR | Status: AC
  Administered 2022-10-28: 50 ug via INTRAMUSCULAR
  Filled 2022-10-28: qty 1

## 2022-10-28 MED ORDER — LIDOCAINE HCL (PF) 1 % IJ SOLN
30.0000 mL | Freq: Once | INTRAMUSCULAR | Status: AC
  Administered 2022-10-28: 30 mL
  Filled 2022-10-28: qty 30

## 2022-10-28 MED ORDER — TETANUS-DIPHTH-ACELL PERTUSSIS 5-2.5-18.5 LF-MCG/0.5 IM SUSY
0.5000 mL | PREFILLED_SYRINGE | Freq: Once | INTRAMUSCULAR | Status: AC
  Administered 2022-10-28: 0.5 mL via INTRAMUSCULAR
  Filled 2022-10-28: qty 0.5

## 2022-10-28 NOTE — Discharge Instructions (Addendum)
You were seen here today for your finger lacerations.  You had 4 sutures placed in the lower laceration of the ring finger, 8 sutures in the upper laceration of the ring finger, 3 sutures in the middle finger, 3 sutures in your index finger.  You must get your sutures removed in 10-14 days. We recommend visiting your PCP or an urgent care for suture removal. However, you may also return back to the ER if you are unable to be seen by your PCP or at urgent care.   You may gently clean the area around your laceration as needed with soap and water. Place antibiotic ointment such as bacitracin or neosporin over your laceration after cleaning the area.  Keep the laceration covered with sterile gauze as shown here if you are doing an activity in which it may get dirty. You may pick these supplies up at any drugstore.  Do not submerge your laceration in water (no baths, swimming) until it is fully healed. You may shower.   You may use up to 800mg  ibuprofen every 6 hours as needed for pain.   Return to the ER should you develop fever, chills, pus drainage from your wound, redness around your wound.

## 2022-10-28 NOTE — ED Provider Notes (Signed)
[facility] Provider Note   CSN: [id number] Arrival date & time: 10/28/22  1826     History {Add pertinent medical, surgical, social history, OB history to HPI:1} Chief Complaint  Patient presents with   Laceration    Champaigne Kissee is a 77 y.o. female with history of Raynaud's and lupus who presents with concern for cutting herself with a hedge tremor approximately 1 hour prior to arrival.  She sustained cuts to her left 1st through 4th digits.  Last tetanus is unknown.  She notes some numbness in her left ring finger fingertip.  Denies any loss of range of motion.   Laceration      Home Medications Prior to Admission medications   Medication Sig Start Date End Date Taking? Authorizing Provider  Biotin 10 MG TABS Take by mouth.    [provider]  Cholecalciferol (VITAMIN D3) 2000 units TABS Take 2,000 Units by mouth daily.    [provider]  DULoxetine (CYMBALTA) 30 MG capsule Take 1 capsule (30 mg total) by mouth daily. 08/22/22   Eulis Foster, FNP  DULoxetine (CYMBALTA) 30 MG capsule Take 1 capsule (30 mg total) by mouth daily. 08/26/22   Sandford Craze, NP  furosemide (LASIX) 20 MG tablet 1 tablet by mouth once daily as needed for edema 12/31/21   Sandford Craze, NP  GLUCOSAMINE-CHONDROITIN PO Take 1 tablet by mouth 2 (two) times daily.    [provider]  HYDROcodone-acetaminophen (NORCO/VICODIN) 5-325 MG tablet Take one or two daily, not to exceed 45/month 11/17/18   Levert Feinstein, MD  losartan (COZAAR) 25 MG tablet Take 1 tablet (25 mg total) by mouth daily. 08/11/22   Sandford Craze, NP  magnesium oxide (MAG-OX) 400 MG tablet Take 400 mg by mouth daily.    [provider]  MAGNESIUM PO Take by mouth. 05/22/20   [provider]  omeprazole (PRILOSEC) 40 MG capsule Take 1 capsule (40 mg total) by mouth every evening. 09/15/22   Sandford Craze, NP  rosuvastatin (CRESTOR) 20  MG tablet Take 1 tablet (20 mg total) by mouth daily. 05/07/22   Sandford Craze, NP  Semaglutide, 1 MG/DOSE, (OZEMPIC, 1 MG/DOSE,) 4 MG/3ML SOPN Ozempic 1 mg once a week 09/15/22   Sandford Craze, NP  sulfamethoxazole-trimethoprim (BACTRIM DS) 800-160 MG tablet Take 1 tablet by mouth 2 (two) times daily. 05/06/22   Sandford Craze, NP  triamterene-hydrochlorothiazide (MAXZIDE-25) 37.5-25 MG tablet Take 0.5 tablets by mouth daily. 07/30/22   Sandford Craze, NP  Turmeric 500 MG TABS Take by mouth.    [provider]  zolpidem (AMBIEN) 5 MG tablet Take 1 tablet (5 mg total) by mouth at bedtime. 09/26/22   Sandford Craze, NP      Allergies    Codeine    Review of Systems   Review of Systems  Skin:  Positive for wound.    Physical Exam Updated Vital Signs BP 126/75 (BP Location: Right Arm)   Pulse 92   Temp 97.8 F (36.6 C)   Resp 20   Ht 5\' 8"  (1.727 m)   Wt 78 kg   SpO2 95%   BMI 26.15 kg/m  Physical Exam Vitals and nursing note reviewed.  Constitutional:      Appearance: Normal appearance.  HENT:     Head: Atraumatic.  Pulmonary:     Effort: Pulmonary effort is normal.  Musculoskeletal:     Comments: Able to fully flex and extend at the MTP, PIP,  DIP of the left hand 1st through 5th digits. Sensation intact of the left hand 1st through 5th digits Laceration to the left thumb over the distal phalanx, left pointer finger over the middle phalanx, small abrasion to the middle finger over the proximal phalanx, laceration to the left ring finger distal phalanx Bleeding well-controlled upon arrival, no foreign bodies noted  Neurological:     General: No focal deficit present.     Mental Status: She is alert.  Psychiatric:        Mood and Affect: Mood normal.        Behavior: Behavior normal.       ED Results / Procedures / Treatments   Labs (all labs ordered are listed, but only abnormal results are displayed) Labs Reviewed - No data to  display  EKG None  Radiology No results found.  Procedures Procedures  {Document cardiac monitor, telemetry assessment procedure when appropriate:1}  Medications Ordered in ED Medications  fentaNYL (SUBLIMAZE) injection 50 mcg (has no administration in time range)  Tdap (BOOSTRIX) injection 0.5 mL (0.5 mLs Intramuscular Given 10/28/22 1901)  lidocaine (PF) (XYLOCAINE) 1 % injection 30 mL (30 mLs Infiltration Given 10/28/22 1900)    ED Course/ Medical Decision Making/ A&P   {   Click here for ABCD2, HEART and other calculatorsREFRESH Note before signing :1}                              Medical Decision Making Amount and/or Complexity of Data Reviewed Radiology: ordered.  Risk Prescription drug management.   ***  {Document critical care time when appropriate:1} {Document review of labs and clinical decision tools ie heart score, Chads2Vasc2 etc:1}  {Document your independent review of radiology images, and any outside records:1} {Document your discussion with family members, caretakers, and with consultants:1} {Document social determinants of health affecting pt's care:1} {Document your decision making why or why not admission, treatments were needed:1} Final Clinical Impression(s) / ED Diagnoses Final diagnoses:  None    Rx / DC Orders ED Discharge Orders     None

## 2022-10-28 NOTE — ED Triage Notes (Signed)
Pt has multiple lacerations to left hand from hedge trimmers, worse is on thumb and 4th finger  Bleeding controlled Tdap unknown

## 2022-11-03 ENCOUNTER — Telehealth: Payer: Self-pay | Admitting: Family

## 2022-11-03 NOTE — Telephone Encounter (Signed)
I spoke with the patient, who mentioned that she had just missed a call from [name]. I informed her that Maria Brennan was calling to schedule her AWV, but the patient explained that she already has one scheduled. She isn't interested in setting up a new appointment at this time, as she recently switched doctors.

## 2022-11-03 NOTE — Telephone Encounter (Signed)
Copied from CRM [id number]. Topic: Medicare AWV >> Nov 03, 2022 10:34 AM Payton Doughty wrote: Reason for CRM: LM 11/03/2022 to schedule AWV   Verlee Rossetti; Care Guide Ambulatory Clinical Support [facility] Direct Dial: [phone]

## 2022-11-04 ENCOUNTER — Ambulatory Visit: Payer: Medicare Other | Admitting: Family

## 2023-07-10 IMAGING — MG MM DIGITAL SCREENING BILAT W/ TOMO AND CAD
8 of 14 series · 8 of 40 positions shown · non-contrast
Comparison: Previous exam(s).

CLINICAL DATA: Screening. Patient has had bilateral silicone
implants removed since her prior mammogram.

EXAM:
DIGITAL SCREENING BILATERAL MAMMOGRAM WITH TOMOSYNTHESIS AND CAD
TECHNIQUE: Bilateral screening digital craniocaudal and mediolateral oblique
mammograms were obtained. Bilateral screening digital breast
tomosynthesis was performed. The images were evaluated with
computer-aided detection.

[R MLO synth-2D (1 of 2)]
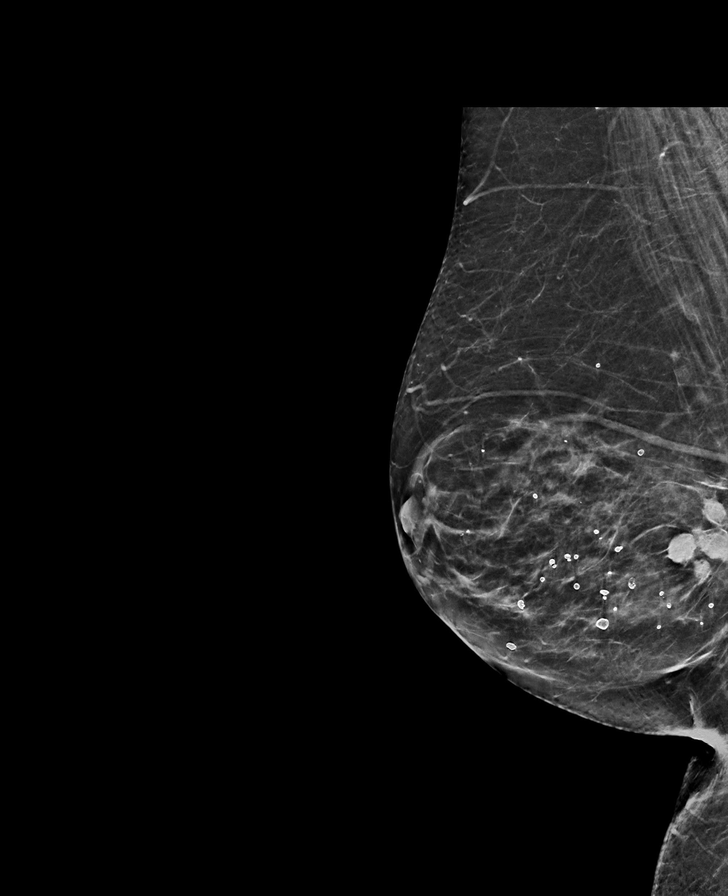

[L XCCL synth-2D]
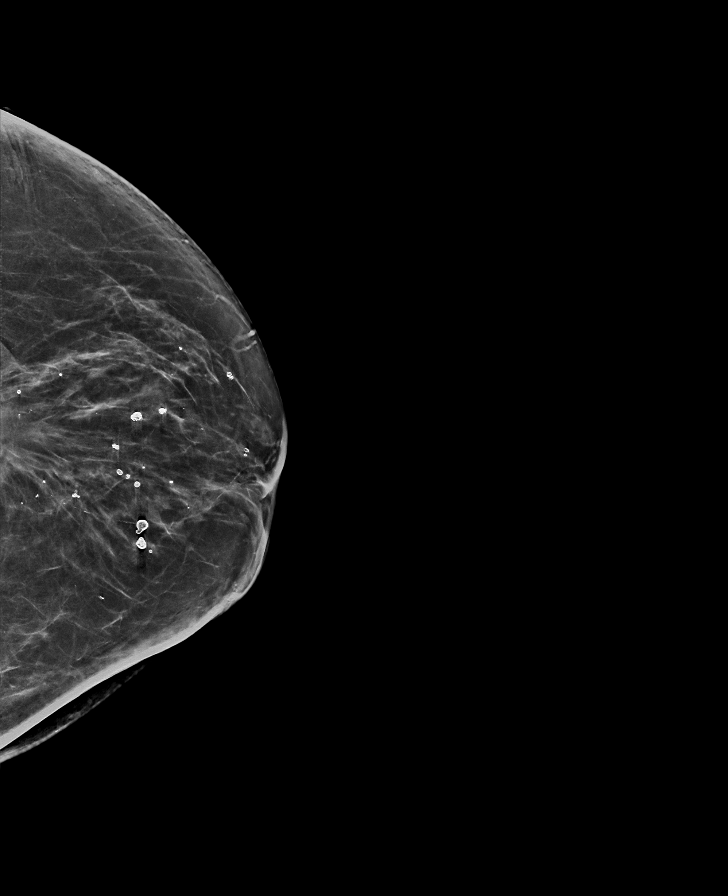

[L CC synth-2D]
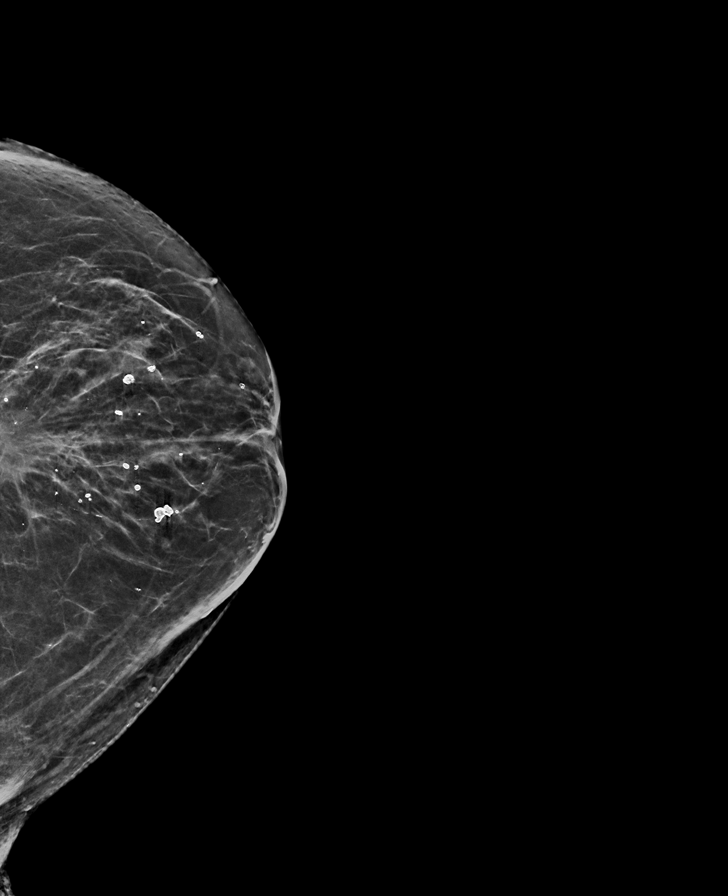

[R CC synth-2D]
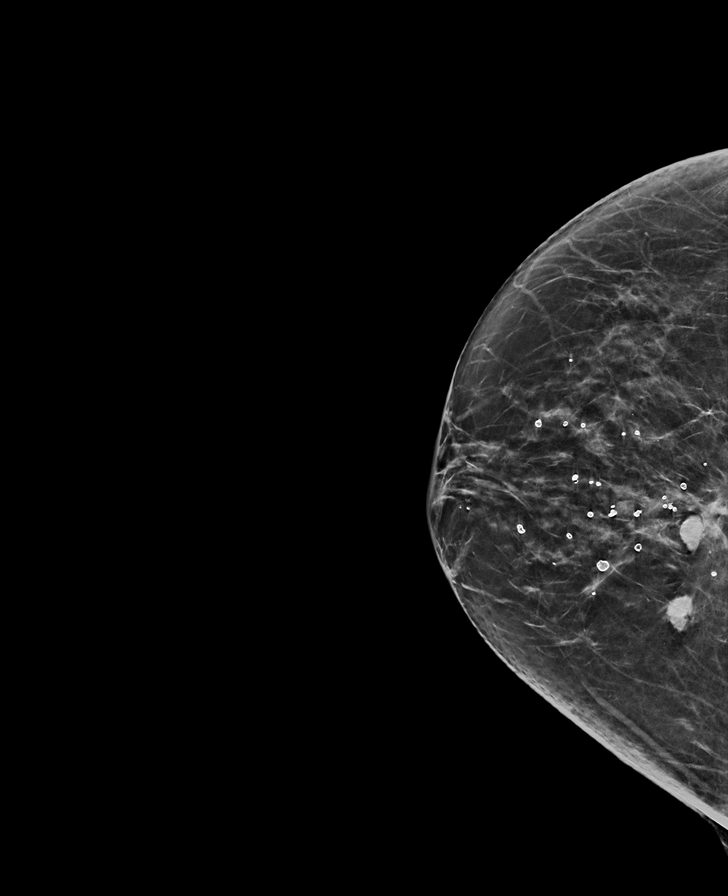

[L MLO synth-2D]
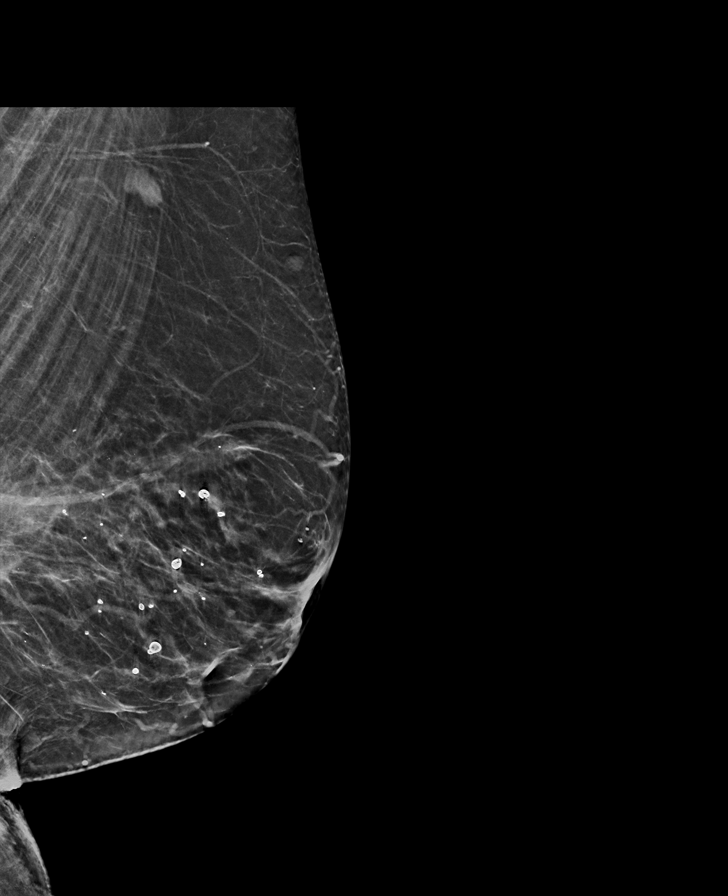

[R MLO synth-2D (2 of 2)]
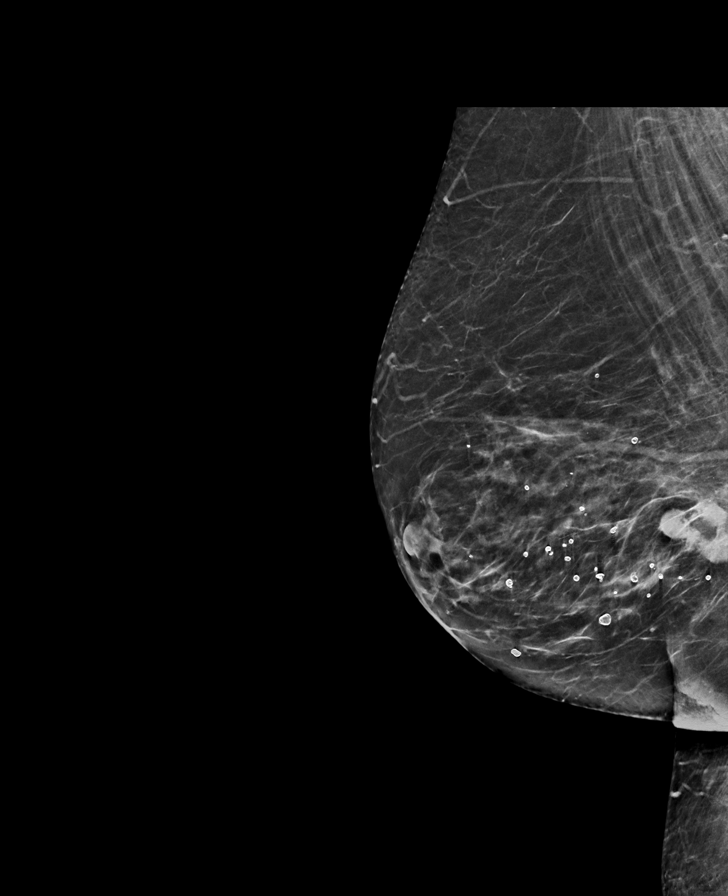

[R XCCL synth-2D]
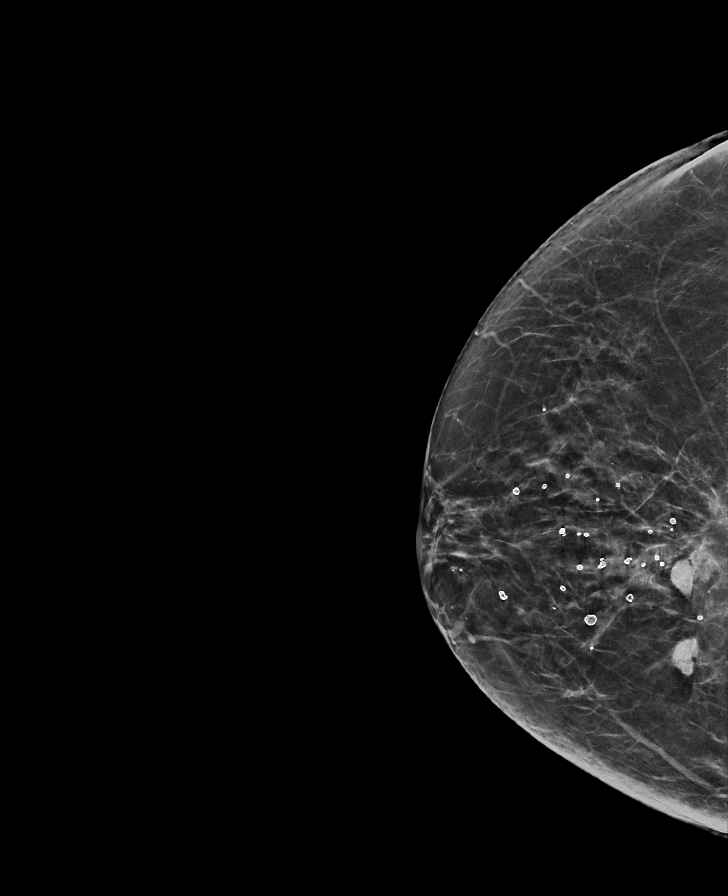

[L CC tomo · tomo slice 30/59.0]
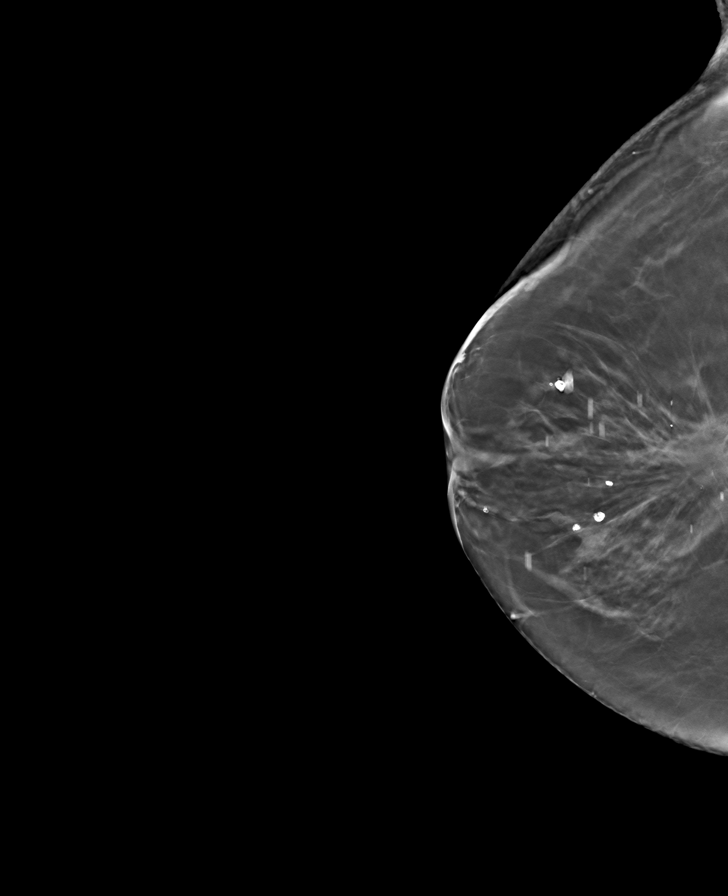

[8 of 40 positions shown; findings below may reference images not displayed]

ACR Breast Density Category b: There are scattered areas of
fibroglandular density.
FINDINGS: There are no findings suspicious for malignancy.

Architectural distortion consistent with scarring and associated
residual free silicone noted in the posterior, slightly lower inner
right breast. There is focal opacity and architectural distortion
consistent with scarring in the posterior, slightly inferior left
breast.
IMPRESSION: No mammographic evidence of malignancy.

Benign changes related to the previous silicone implants and their
subsequent removal.

A result letter of this screening mammogram will be mailed directly
to the patient.

RECOMMENDATION:
Screening mammogram in one year. (Code:HD-9-43S)

BI-RADS CATEGORY  2: Benign.

## 2023-11-02 ENCOUNTER — Other Ambulatory Visit: Payer: Self-pay | Admitting: Family

## 2023-11-02 DIAGNOSIS — Z780 Asymptomatic menopausal state: Secondary | ICD-10-CM

## 2023-11-12 ENCOUNTER — Telehealth (HOSPITAL_BASED_OUTPATIENT_CLINIC_OR_DEPARTMENT_OTHER): Payer: Self-pay

## 2024-02-20 ENCOUNTER — Other Ambulatory Visit: Payer: Self-pay

## 2024-02-20 ENCOUNTER — Emergency Department (HOSPITAL_COMMUNITY)
Admission: EM | Admit: 2024-02-20 | Discharge: 2024-02-21 | Disposition: A | Discharge status: Home / Self Care | Attending: Emergency Medicine | Admitting: Emergency Medicine

## 2024-02-20 DIAGNOSIS — S0101XA Laceration without foreign body of scalp, initial encounter: Secondary | ICD-10-CM | POA: Diagnosis not present

## 2024-02-20 DIAGNOSIS — S0990XA Unspecified injury of head, initial encounter: Secondary | ICD-10-CM | POA: Diagnosis present

## 2024-02-20 DIAGNOSIS — T148XXA Other injury of unspecified body region, initial encounter: Secondary | ICD-10-CM

## 2024-02-20 DIAGNOSIS — I6521 Occlusion and stenosis of right carotid artery: Secondary | ICD-10-CM | POA: Diagnosis not present

## 2024-02-20 DIAGNOSIS — M5030 Other cervical disc degeneration, unspecified cervical region: Secondary | ICD-10-CM | POA: Diagnosis not present

## 2024-02-20 DIAGNOSIS — W108XXA Fall (on) (from) other stairs and steps, initial encounter: Secondary | ICD-10-CM | POA: Diagnosis not present

## 2024-02-20 DIAGNOSIS — I6529 Occlusion and stenosis of unspecified carotid artery: Secondary | ICD-10-CM

## 2024-02-20 DIAGNOSIS — W19XXXA Unspecified fall, initial encounter: Secondary | ICD-10-CM

## 2024-02-20 MED ORDER — TETANUS-DIPHTH-ACELL PERTUSSIS 5-2-15.5 LF-MCG/0.5 IM SUSP: 0.5000 mL | Freq: Once | INTRAMUSCULAR | Status: DC

## 2024-02-20 NOTE — ED Provider Triage Note (Signed)
 Emergency Medicine Provider Triage Evaluation Note  Tania Steinhauser , a 78 y.o. female  was evaluated in triage.  Pt complains of fall.  Review of Systems  Positive: wound Negative: Leg deformity   Physical Exam  BP (!) 148/71 (BP Location: Right Arm)   Pulse (!) 101   Temp 98.2 F (36.8 C) (Oral)   Resp 18   Ht 5' 8 (1.727 m)   Wt 78 kg   SpO2 100%   BMI 26.15 kg/m  Gen:   Awake, no distress   Resp:  Normal effort  MSK:   Moves extremities without difficulty  Other:  EOMI  Medical Decision Making  Medically screening exam initiated at 11:47 PM.  Appropriate orders placed.  Jocee Kissick was informed that the remainder of the evaluation will be completed by another provider, this initial triage assessment does not replace that evaluation, and the importance of remaining in the ED until their evaluation is complete.     Raylen Ken, MD 02/20/24 2350

## 2024-02-20 NOTE — ED Triage Notes (Signed)
 Patient bib [facility] from home after she had a fall down about 8 steps. No LOC, No thinners. 2 lacerations to the back of the head. Had some nausea was given 4mg  zofran  enroute. A&Ox4. She has back pain, left knee pain and head pain.

## 2024-02-21 ENCOUNTER — Emergency Department (HOSPITAL_COMMUNITY)

## 2024-02-21 DIAGNOSIS — S0101XA Laceration without foreign body of scalp, initial encounter: Secondary | ICD-10-CM | POA: Diagnosis not present

## 2024-02-21 MED ORDER — LIDOCAINE-EPINEPHRINE-TETRACAINE (LET) TOPICAL GEL
3.0000 mL | Freq: Once | TOPICAL | Status: AC
  Administered 2024-02-21: 3 mL via TOPICAL
  Filled 2024-02-21: qty 3

## 2024-02-21 MED ORDER — LIDOCAINE 5 % EX PTCH
3.0000 | MEDICATED_PATCH | CUTANEOUS | Status: DC
  Administered 2024-02-21: 3 via TRANSDERMAL
  Filled 2024-02-21: qty 3

## 2024-02-21 MED ORDER — OXYCODONE-ACETAMINOPHEN 5-325 MG PO TABS
1.0000 | ORAL_TABLET | ORAL | Status: DC | PRN
  Administered 2024-02-21: 1 via ORAL
  Filled 2024-02-21: qty 1

## 2024-02-21 MED ORDER — LIDOCAINE 5 % EX PTCH: 1.0000 | MEDICATED_PATCH | CUTANEOUS | 0 refills | Status: AC

## 2024-02-21 MED ORDER — ONDANSETRON 4 MG PO TBDP
4.0000 mg | ORAL_TABLET | Freq: Once | ORAL | Status: AC
  Administered 2024-02-21: 4 mg via ORAL
  Filled 2024-02-21: qty 1

## 2024-02-21 NOTE — ED Provider Notes (Signed)
 [place] EMERGENCY DEPARTMENT AT [facility] Provider Note   CSN: [id number] Arrival date & time: 02/20/24  2255     Patient presents with: Fall and Head Injury   Maria Brennan is a 78 y.o. female.   The history is provided by the patient.  Fall This is a new problem. The current episode started less than 1 hour ago.  Head Injury      Prior to Admission medications  Medication Sig Start Date End Date Taking? Authorizing Provider  lidocaine  (LIDODERM ) 5 % Place 1 patch onto the skin daily. Remove & Discard patch within 12 hours or as directed by MD 02/21/24  Yes Blaise Grieshaber, MD  Biotin 10 MG TABS Take by mouth.    [provider]  Cholecalciferol (VITAMIN D3) 2000 units TABS Take 2,000 Units by mouth daily.    [provider]  DULoxetine  (CYMBALTA ) 30 MG capsule Take 1 capsule (30 mg total) by mouth daily. 08/22/22   Webb, Padonda B, FNP  DULoxetine  (CYMBALTA ) 30 MG capsule Take 1 capsule (30 mg total) by mouth daily. 08/26/22   O'Sullivan, Melissa, NP  furosemide  (LASIX ) 20 MG tablet 1 tablet by mouth once daily as needed for edema 12/31/21   O'Sullivan, Melissa, NP  GLUCOSAMINE-CHONDROITIN PO Take 1 tablet by mouth 2 (two) times daily.    [provider]  HYDROcodone -acetaminophen  (NORCO/VICODIN) 5-325 MG tablet Take one or two daily, not to exceed 45/month 11/17/18   Onita Duos, MD  losartan  (COZAAR ) 25 MG tablet Take 1 tablet (25 mg total) by mouth daily. 08/11/22   O'Sullivan, Melissa, NP  magnesium  oxide (MAG-OX) 400 MG tablet Take 400 mg by mouth daily.    [provider]  MAGNESIUM  PO Take by mouth. 05/22/20   [provider]  omeprazole  (PRILOSEC) 40 MG capsule Take 1 capsule (40 mg total) by mouth every evening. 09/15/22   O'Sullivan, Melissa, NP  rosuvastatin  (CRESTOR ) 20 MG tablet Take 1 tablet (20 mg total) by mouth daily. 05/07/22   Daryl Setter, NP  Semaglutide , 1 MG/DOSE, (OZEMPIC , 1 MG/DOSE,) 4 MG/3ML SOPN  Ozempic  1 mg once a week 09/15/22   O'Sullivan, Melissa, NP  sulfamethoxazole -trimethoprim  (BACTRIM  DS) 800-160 MG tablet Take 1 tablet by mouth 2 (two) times daily. 05/06/22   O'Sullivan, Melissa, NP  triamterene -hydrochlorothiazide  (MAXZIDE -25) 37.5-25 MG tablet Take 0.5 tablets by mouth daily. 07/30/22   Daryl Setter, NP  Turmeric 500 MG TABS Take by mouth.    [provider]  zolpidem  (AMBIEN ) 5 MG tablet Take 1 tablet (5 mg total) by mouth at bedtime. 09/26/22   O'Sullivan, Melissa, NP    Allergies: Codeine    Review of Systems  Updated Vital Signs BP (!) 145/71 (BP Location: Left Arm)   Pulse 96   Temp 98.1 F (36.7 C)   Resp 16   Ht 5' 8 (1.727 m)   Wt 78 kg   SpO2 98%   BMI 26.15 kg/m   Physical Exam Vitals and nursing note reviewed.  Constitutional:      General: She is not in acute distress.    Appearance: Normal appearance. She is well-developed.  HENT:     Head: Normocephalic.      Nose: Nose normal.     Mouth/Throat:     Mouth: Mucous membranes are moist.  Eyes:     Pupils: Pupils are equal, round, and reactive to light.  Cardiovascular:     Rate and Rhythm: Normal rate and regular rhythm.  Pulses: Normal pulses.     Heart sounds: Normal heart sounds.  Pulmonary:     Effort: Pulmonary effort is normal. No respiratory distress.     Breath sounds: Normal breath sounds.  Abdominal:     General: Bowel sounds are normal. There is no distension.     Palpations: Abdomen is soft.     Tenderness: There is no abdominal tenderness. There is no guarding or rebound.  Musculoskeletal:        General: Normal range of motion.     Right wrist: No snuff box tenderness or crepitus.     Left wrist: No snuff box tenderness or crepitus.       Arms:     Cervical back: Normal, normal range of motion and neck supple. No tenderness.     Thoracic back: Normal. No bony tenderness.  Lymphadenopathy:     Cervical: No cervical adenopathy.  Skin:    General: Skin  is warm and dry.     Capillary Refill: Capillary refill takes less than 2 seconds.     Findings: No erythema or rash.  Neurological:     General: No focal deficit present.     Mental Status: She is alert.     Deep Tendon Reflexes: Reflexes normal.  Psychiatric:        Mood and Affect: Mood normal.     (all labs ordered are listed, but only abnormal results are displayed) Labs Reviewed - No data to display  EKG: None  Radiology: CT Head Wo Contrast Result Date: 02/21/2024 EXAM: CT HEAD AND CERVICAL SPINE 02/21/2024 12:36:00 AM TECHNIQUE: CT of the head and cervical spine was performed without the administration of intravenous contrast. Multiplanar reformatted images are provided for review. Automated exposure control, iterative reconstruction, and/or weight based adjustment of the mA/kV was utilized to reduce the radiation dose to as low as reasonably achievable. COMPARISON: Cervical spine MRI 07/09/2017, MRI brain without and with contrast 11/30/2017. CLINICAL HISTORY: Polytrauma, blunt. FINDINGS: CT HEAD BRAIN AND VENTRICLES: Mild cerebral atrophy. Small vessel disease with chronically prominent Virchow-Robin spaces in the basal ganglia. Cerebellum and brainstem are unremarkable. Partially empty sella. Calcific plaques in both carotid siphons without hyperdense vessels. No acute intracranial hemorrhage. No mass effect or midline shift. No abnormal extra-axial fluid collection. No evidence of acute infarct. No hydrocephalus. ORBITS: No acute abnormality. SINUSES AND MASTOIDS: No acute abnormality. SOFT TISSUES AND SKULL: Left posterior parietal scalp hematoma. No evidence of depressed skull fractures. CT CERVICAL SPINE BONES AND ALIGNMENT: Trace degenerative anterolisthesis at C3-C4 and C4-C5, seen previously, unchanged. No traumatic or further listhesis is seen. Bone-on-bone anterior atlanto-dental joint space loss with marginal osteophytes. Osteopenia. No evidence of fractures or focal  abnormalities or pathologic bone lesion. No acute fracture or traumatic malalignment. DEGENERATIVE CHANGES: The cervical discs are variably degenerated. The greatest disc collapse is at C5-C6 and C6-C7. There is bidirectional endplate spurring throughout but it is greatest at these two levels. Left greater than right facet joint hypertrophy is seen along with uncinate arthrosis. There is facetal and partial interbody ankylosis at C2-C3. At C5-C6, there is severe right and moderate to severe left acquired foraminal stenosis. Moderate foraminal stenosis bilaterally at C6-C7. Moderate foraminal stenosis on the right at C7-T1. No high-grade spinal stenosis is seen. No herniated discs or cord compromise. SOFT TISSUES: No prevertebral soft tissue swelling. No thyroid mass. Both proximal cervical internal carotid arteries (ICAs) are heavily calcified with almost certain flow limiting origins stenoses. Follow up as indicated. No  spinal canal hematoma. Lung apices demonstrate pleuroparenchymal scarring without pneumothorax. IMPRESSION: 1. No acute intracranial CT findings. 2. Left posterior parietal scalp hematoma without evidence of depressed skull fractures. 3. No acute fracture or traumatic malalignment of the cervical spine. . Osteopenia . SABRA 4. Severe right and moderate to severe left foraminal stenosis at C5-6, with moderate foraminal stenosis bilaterally at C6-7 and on the right at C7-T1. 5. Trace degenerative anterolisthesis at C3-4 and C4-5, unchanged. 6. Heavily calcified proximal cervical internal carotid arteries with likely flow-limiting origin stenoses; consider vascular evaluation. Electronically signed by: Francis Quam MD 02/21/2024 01:10 AM EST RP Workstation: [id number]   CT Cervical Spine Wo Contrast Result Date: 02/21/2024 EXAM: CT HEAD AND CERVICAL SPINE 02/21/2024 12:36:00 AM TECHNIQUE: CT of the head and cervical spine was performed without the administration of intravenous contrast. Multiplanar  reformatted images are provided for review. Automated exposure control, iterative reconstruction, and/or weight based adjustment of the mA/kV was utilized to reduce the radiation dose to as low as reasonably achievable. COMPARISON: Cervical spine MRI 07/09/2017, MRI brain without and with contrast 11/30/2017. CLINICAL HISTORY: Polytrauma, blunt. FINDINGS: CT HEAD BRAIN AND VENTRICLES: Mild cerebral atrophy. Small vessel disease with chronically prominent Virchow-Robin spaces in the basal ganglia. Cerebellum and brainstem are unremarkable. Partially empty sella. Calcific plaques in both carotid siphons without hyperdense vessels. No acute intracranial hemorrhage. No mass effect or midline shift. No abnormal extra-axial fluid collection. No evidence of acute infarct. No hydrocephalus. ORBITS: No acute abnormality. SINUSES AND MASTOIDS: No acute abnormality. SOFT TISSUES AND SKULL: Left posterior parietal scalp hematoma. No evidence of depressed skull fractures. CT CERVICAL SPINE BONES AND ALIGNMENT: Trace degenerative anterolisthesis at C3-C4 and C4-C5, seen previously, unchanged. No traumatic or further listhesis is seen. Bone-on-bone anterior atlanto-dental joint space loss with marginal osteophytes. Osteopenia. No evidence of fractures or focal abnormalities or pathologic bone lesion. No acute fracture or traumatic malalignment. DEGENERATIVE CHANGES: The cervical discs are variably degenerated. The greatest disc collapse is at C5-C6 and C6-C7. There is bidirectional endplate spurring throughout but it is greatest at these two levels. Left greater than right facet joint hypertrophy is seen along with uncinate arthrosis. There is facetal and partial interbody ankylosis at C2-C3. At C5-C6, there is severe right and moderate to severe left acquired foraminal stenosis. Moderate foraminal stenosis bilaterally at C6-C7. Moderate foraminal stenosis on the right at C7-T1. No high-grade spinal stenosis is seen. No herniated  discs or cord compromise. SOFT TISSUES: No prevertebral soft tissue swelling. No thyroid mass. Both proximal cervical internal carotid arteries (ICAs) are heavily calcified with almost certain flow limiting origins stenoses. Follow up as indicated. No spinal canal hematoma. Lung apices demonstrate pleuroparenchymal scarring without pneumothorax. IMPRESSION: 1. No acute intracranial CT findings. 2. Left posterior parietal scalp hematoma without evidence of depressed skull fractures. 3. No acute fracture or traumatic malalignment of the cervical spine. . Osteopenia . SABRA 4. Severe right and moderate to severe left foraminal stenosis at C5-6, with moderate foraminal stenosis bilaterally at C6-7 and on the right at C7-T1. 5. Trace degenerative anterolisthesis at C3-4 and C4-5, unchanged. 6. Heavily calcified proximal cervical internal carotid arteries with likely flow-limiting origin stenoses; consider vascular evaluation. Electronically signed by: Francis Quam MD 02/21/2024 01:10 AM EST RP Workstation: [id number]     .Laceration Repair  Date/Time: 02/21/2024 6:42 AM  Performed by: Nettie Earing, MD Authorized by: Nettie Earing, MD   Consent:    Consent obtained:  Verbal   Consent given by:  Patient  Risks discussed:  Infection, nerve damage, need for additional repair, poor cosmetic result and poor wound healing   Alternatives discussed:  No treatment Universal protocol:    Patient identity confirmed:  Arm band Anesthesia:    Anesthesia method:  Topical application   Topical anesthetic:  LET Laceration details:    Location:  Scalp   Scalp location:  R parietal   Length (cm):  0.9   Depth (mm):  0.5 Pre-procedure details:    Preparation:  Patient was prepped and draped in usual sterile fashion Exploration:    Hemostasis achieved with:  Direct pressure   Imaging outcome: foreign body not noted     Wound exploration: wound explored through full range of motion     Wound extent: fascia not  violated, no foreign body, no signs of injury, no nerve damage, no tendon damage, no underlying fracture and no vascular damage     Contaminated: no   Treatment:    Area cleansed with:  Povidone-iodine, chlorhexidine , saline and Shur-Clens   Amount of cleaning:  Extensive   Irrigation solution:  Sterile saline   Irrigation method:  Syringe and pressure wash   Debridement:  None   Undermining:  None Skin repair:    Repair method:  Staples   Number of staples:  1 Approximation:    Approximation:  Close Repair type:    Repair type:  Simple Post-procedure details:    Dressing:  Open (no dressing)   Procedure completion:  Tolerated well, no immediate complications Comments:     Right side of head  .Laceration Repair  Date/Time: 02/21/2024 6:44 AM  Performed by: Nettie Earing, MD Authorized by: Nettie Earing, MD   Consent:    Consent given by:  Patient   Risks discussed:  Need for additional repair, nerve damage, poor cosmetic result, poor wound healing, pain, infection, retained foreign body, tendon damage and vascular damage Universal protocol:    Patient identity confirmed:  Arm band Anesthesia:    Anesthesia method:  Topical application   Topical anesthetic:  LET Laceration details:    Location:  Scalp   Scalp location:  Occipital   Length (cm):  1.3   Depth (mm):  0.5 Exploration:    Hemostasis achieved with:  Cautery   Wound extent: fascia not violated, no foreign body and no signs of injury   Treatment:    Area cleansed with:  Povidone-iodine, chlorhexidine , Shur-Clens and saline   Amount of cleaning:  Extensive   Irrigation solution:  Sterile saline   Irrigation method:  Syringe   Debridement:  None Skin repair:    Repair method:  Staples   Number of staples:  3 Approximation:    Approximation:  Close Repair type:    Repair type:  Simple Post-procedure details:    Dressing:  Open (no dressing)   Procedure completion:  Tolerated well, no immediate  complications .Laceration Repair  Date/Time: 02/21/2024 6:46 AM  Performed by: Nettie Earing, MD Authorized by: Nettie Earing, MD   Consent:    Consent obtained:  Verbal   Consent given by:  Patient   Risks discussed:  Infection, need for additional repair, nerve damage, poor wound healing, poor cosmetic result, pain, tendon damage and vascular damage   Alternatives discussed:  No treatment Universal protocol:    Patient identity confirmed:  Arm band Anesthesia:    Anesthesia method:  Topical application   Topical anesthetic:  LET Laceration details:    Location:  Scalp   Scalp location:  Occipital   Length (cm):  0.9   Depth (mm):  0.5 Pre-procedure details:    Preparation:  Patient was prepped and draped in usual sterile fashion Exploration:    Hemostasis achieved with:  Direct pressure   Wound extent: fascia not violated, no foreign body, no signs of injury, no nerve damage, no tendon damage, no underlying fracture and no vascular damage     Contaminated: no   Treatment:    Area cleansed with:  Povidone-iodine, chlorhexidine , saline and Shur-Clens   Amount of cleaning:  Extensive   Debridement:  None Skin repair:    Repair method:  Staples   Number of staples:  1 Approximation:    Approximation:  Close Repair type:    Repair type:  Simple Post-procedure details:    Dressing:  Open (no dressing)   Procedure completion:  Tolerated well, no immediate complications .Laceration Repair  Date/Time: 02/21/2024 6:47 AM  Performed by: Nettie Earing, MD Authorized by: Nettie Earing, MD   Consent:    Consent obtained:  Verbal   Consent given by:  Patient   Risks discussed:  Infection, need for additional repair, nerve damage, poor cosmetic result, poor wound healing, pain, tendon damage and vascular damage Universal protocol:    Patient identity confirmed:  Arm band Anesthesia:    Anesthesia method:  Topical application   Topical anesthetic:  LET Laceration  details:    Location:  Scalp   Scalp location:  Occipital   Length (cm):  0.8   Depth (mm):  0.5 Pre-procedure details:    Preparation:  Patient was prepped and draped in usual sterile fashion Exploration:    Wound extent: fascia not violated, no foreign body, no signs of injury, no nerve damage, no tendon damage, no underlying fracture and no vascular damage   Treatment:    Area cleansed with:  Povidone-iodine, chlorhexidine , saline and Shur-Clens   Amount of cleaning:  Extensive   Irrigation method:  Pressure wash and syringe   Debridement:  None Skin repair:    Repair method:  Staples   Number of staples:  1 Approximation:    Approximation:  Close Post-procedure details:    Dressing:  Open (no dressing)   Procedure completion:  Tolerated well, no immediate complications    Medications Ordered in the ED  Tdap (ADACEL) injection 0.5 mL (0.5 mLs Intramuscular Patient Refused/Not Given 02/21/24 0013)  oxyCODONE -acetaminophen  (PERCOCET/ROXICET) 5-325 MG per tablet 1 tablet (1 tablet Oral Given 02/21/24 0443)  lidocaine  (LIDODERM ) 5 % 3 patch (1 patch Transdermal Incomplete 02/21/24 0636)  ondansetron  (ZOFRAN -ODT) disintegrating tablet 4 mg (4 mg Oral Given 02/21/24 0443)  lidocaine -EPINEPHrine -tetracaine  (LET) topical gel (3 mLs Topical Given 02/21/24 0600)                                    Medical Decision Making Patient BIB for a fall on stairs in high heels she hit the back of her head on wooden stairs.    Amount and/or Complexity of Data Reviewed External Data Reviewed: notes.    Details: Previous notes reviewed  Radiology: ordered and independent interpretation performed.    Details: Negative CT head  Risk Prescription drug management. Risk Details: Wound care instructions given.  No submersion for 2 weeks.  Patient and family informed of both DDD and carotid calcifications verbally and in writing. Will need to follow up with PMD, urgent care for staple in 5 days.  Patient to follow up with Dr. Serene of vascular regarding calcified carotid.  Stable for discharge with close follow up.      Final diagnoses:  Fall, initial encounter  DDD (degenerative disc disease), cervical  Carotid artery calcification, unspecified laterality  Bruising  Laceration of scalp, initial encounter   No signs of systemic illness or infection. The patient is nontoxic-appearing on exam and vital signs are within normal limits.  I have reviewed the triage vital signs and the nursing notes. Pertinent labs & imaging results that were available during my care of the patient were reviewed by me and considered in my medical decision making (see chart for details). After history, exam, and medical workup I feel the patient has been appropriately medically screened and is safe for discharge home. Pertinent diagnoses were discussed with the patient. Patient was given return precautions.    ED Discharge Orders          Ordered    lidocaine  (LIDODERM ) 5 %  Every 24 hours        02/21/24 0540               Jonnatan Hanners, MD 02/21/24 [phone]

## 2024-02-21 NOTE — ED Notes (Signed)
 Pt reports HA 10/10.  Brought back to medicate and re-evaluate

## 2024-02-21 NOTE — Discharge Instructions (Addendum)
 It was a pleasure taking care of you today! We hope you feel better soon.  Alternate tylenol  650 mg and ibuprofen 600 mg every 6 hours for pain.  Take all medications on a full stomach to prevent upset.  Suture removal in 5 days at urgent care.  Do not submerge the wounds in any body of water  for 14 days. Johnson's baby shampoo to clean.  Happy Holidays!

## 2024-02-21 NOTE — ED Notes (Signed)
 Pt ok for lobby per Nettie MD

## 2024-02-22 NOTE — Telephone Encounter (Signed)
 Copied from CRM [id number]. Topic: Clinical Concerns - Result Report >> Feb 22, 2024 12:50 PM Maria Brennan wrote: Oveta, Idris is calling other request    Include all details related to the request(s) below:  Patient is requesting a call back from Dr. Delilah patient has a fall on Saturday and was rushed to [facility] had EKG, ultrasound, mri done patient ended up having a head laceration, concussion, and bruising all over body and was diagnosed with carotid artery and needs a emergency referral sent to [facility]  Gaile Malvina Money, MD    Confirm and type the Best Contact Number below:  Patient/caller contact number: [phone]            [] Home  [] Mobile  [] Work [] Other   [] Okay to leave a voicemail   Medication List:  Current Outpatient Medications:    ascorbic acid (VITAMIN C) 1,000 mg tablet, Take 1,000 mg by mouth., Disp: , Rfl:    biotin 10 mg tab tablet, Take  by mouth., Disp: , Rfl:    blood-glucose meter misc, Use Precision xtra meter once daily to check sugars. 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 For flare up of thrush., Disp: 9 tablet, Rfl: 2   glucosamine-chondroitin (CIDAFLEX) 500-400 mg tab per tablet, Take 1 tablet by mouth 2 (two) times a day., Disp: , Rfl:    glucose blood (Precision Xtra Test) test strip, Use 1 Precision xtra strip once daily to check sugars. 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, Disp: 100 strip, Rfl: 3   HYDROcodone -acetaminophen  (NORCO)  5-325 mg per tablet, Take 1 tablet by mouth 2 (two) times a day., Disp: , Rfl:    lancets 30 gauge misc, 1 Stick by miscellaneous route Once Daily., Disp: 100 each, Rfl: 3   losartan  (COZAAR ) 25 mg tablet, Take 1 tablet (25 mg total) by mouth daily., Disp: 90 tablet, Rfl: 3   magnesium  200 mg tab, Take  by mouth., Disp: , Rfl:    omeprazole  (PriLOSEC) 40 mg DR capsule, Take 1 capsule (40 mg total) by mouth daily., Disp: 90 capsule, Rfl: 3   pen needle, diabetic 32 gauge x 5/32 ndle, USE AS DIRECTED, Disp: 100 each, Rfl: 1   rosuvastatin  (CRESTOR ) 40 mg tablet, Take 1 tablet (40 mg total) by mouth daily., Disp: 90 tablet, Rfl: 3   semaglutide  (Ozempic ) 2 mg/dose (8 mg/3 mL) subcutaneous pen injector, Inject 0.75 mL (2 mg total) under the skin every 7 days., Disp: 9 mL, Rfl: 1   triamcinolone  acetonide (KENALOG ) 0.1 % ointment, APPLY ONE APPLICATION TOPICALLY TWO TIMES A DAY TO RASH ON LEFT ARM, Disp: , Rfl:    triamterene -hydroCHLOROthiazide  (MAXZIDE -25) 37.5-25 mg per tablet, Take 0.5 tablets by mouth daily., Disp: 90 tablet, Rfl: 3   zolpidem  (AMBIEN ) 5 mg tablet, Take 1 tablet (5 mg total) by mouth nightly as needed for sleep., Disp: 90 tablet, Rfl: 1     Medication Request/Refills: Pharmacy Information (if  applicable)   [] Not Applicable       []  Pharmacy listed  Send Medication Request to:                                                 [] Pharmacy not listed (added to pharmacy list in Epic) Send Medication Request to:      Listed Pharmacies: [id number] - [place], [place] - [address] - PHONE: [phone] - FAX: [phone] [facility] - [place], [place] - [address] - PHONE: [phone] - FAX: [phone]

## 2024-02-23 NOTE — Telephone Encounter (Signed)
 Patient was in the ED for a fall and laceration to her head and had staples placed. Appointment scheduled with Ole, [place] for EDFU and staple removal 12/19.   Patient was also found to have carotid artery calcification and referred to vascular surgery. States does not want to go to that provider and is asking for a referral to [facility]ovascular in [place], [place] Dr. Dempsey Arko.  Fall, initial encounter (Primary Dx);  DDD (degenerative disc disease), cervical;  Carotid artery calcification, unspecified laterality;  Bruising;  Laceration of scalp, initial encounter

## 2024-02-23 NOTE — Telephone Encounter (Signed)
 Patient is calling to follow up on request.   Advised of message below.   States that she had imaging done already/the carotid dopplers bilat.   States that they already told her arteries are clogged.   States that if  she needs to do another one she will but is requesting for referral to be placed for [place] regardless.   States that she's high risk of a stroke and needs referral as soon as possible.   Requesting for Joann to work on this   Call back: [phone]

## 2024-02-23 NOTE — Telephone Encounter (Signed)
 Patient is requesting referral to still be placed.

## 2024-02-23 NOTE — Telephone Encounter (Signed)
 Patient transferred to [facility]

## 2024-02-24 NOTE — Telephone Encounter (Signed)
 emergency referral sent to [facility]  Gaile Malvina New, MD

## 2024-02-24 NOTE — Telephone Encounter (Signed)
 LMTCB/ advised referral order was placed.

## 2024-02-29 NOTE — Telephone Encounter (Signed)
 No answer, left a voicemail for return call  Callback number provided Patient Portal Message sent

## 2024-02-29 NOTE — Progress Notes (Signed)
 Patient PCP: Maria CHRISTELLA Amos, MD  Assessment and Plan Calcified carotid stenosis Mild stenosis of  the left carotid.  On statin and asa. Repeat US  in 12months  Follow Up 12 months with US   Cardiovascular Problem List  DM Mild left carotid stenosis  On appropriate medication for both.  History of Present Illness/Updates Maria Brennan is a 78 y.o. female is a very pleasant woman that I am seeing virtually. She has no complaints with regards to TIA or stroke like symtpoms She had fall, and a non con ct of the neck demonstrated calcified carotid bifurcations. NO TIA or stroke like symptoms She underwent a doppler with PSV of 200 in the left carotid only. Both were widely patent. She is on appropriate medical therapy.  She should continue with those for now. Repeat US  in 12 months. .    Physical Exam There were no vitals filed for this visit. There is no height or weight on file to calculate BMI. Wt Readings from Last 1 Encounters:  01/11/24 82.2 kg (181 lb 3.2 oz)    Location Information: Patient State (at time of visit): [place]  Patient Location (at time of visit):Home/Other Non-Medical  Provider Location: Hospital/Provider-Based Clinic Is provider licensed to provide clinical care in the current location/state of the patient? Yes   Consent:  Patient's identity was confirmed. Presenting condition or illness was discussed with the patient/personal representative. Current proposed treatment for presenting condition or illness was explained to patient/personal representative along with the likely benefits and any significant risks or complications associated with the provision of treatment by audio/video means. The patient/personal representative verbally authorized treatment to be provided by audio/video, which may include a limited review of patient's current health status, medication, or other treatment recommendations, patient education, and an opportunity to  ask questions about condition and treatment. Verbal Consent Granted by Patient/Personal Representative:Yes   Visit Information: Modality: Audio-Only  Time Spent on Phone w/ Patient: 25 min    Current Medications List Current Outpatient Medications  Medication Instructions   ascorbic acid (VITAMIN C) 1,000 mg   biotin 10 mg tab tablet Take  by mouth.   blood-glucose meter misc Use Precision xtra meter once daily to check sugars. DX: E11.29   calcium  carbonate (OS-CAL) 1500 mg (600 mg calcium ) tablet 1 tablet, Daily   cholecalciferol (VITAMIN D3) 2,000 Units   cyanocobalamin (VITAMIN B12) 1,000 mcg, Daily   DULoxetine  (CYMBALTA ) 30 mg, oral, Daily   fluconazole (DIFLUCAN) 200 mg, oral, Daily, For flare up of thrush.   glucosamine-chondroitin (CIDAFLEX) 500-400 mg tab per tablet 1 tablet, 2 times daily   glucose blood (Precision Xtra Test) test strip Use 1 Precision xtra strip once daily to check sugars. DX: E11.29   HYDROcodone -acetaminophen  (NORCO) 5-325 mg per tablet 1 tablet, 2 times daily   lancets 30 gauge misc 1 Stick, miscellaneous, Daily   losartan  (COZAAR ) 25 mg, oral, Daily   magnesium  200 mg tab Take  by mouth.   omeprazole  (PRILOSEC) 40 mg, oral, Daily   Ozempic  2 mg, subcutaneous, Every 7 days   pen needle, diabetic 32 gauge x 5/32 ndle USE AS DIRECTED   rosuvastatin  (CRESTOR ) 40 mg, oral, Daily   triamcinolone  acetonide (KENALOG ) 0.1 % ointment APPLY ONE APPLICATION TOPICALLY TWO TIMES A DAY TO RASH ON LEFT ARM   triamterene -hydroCHLOROthiazide  (MAXZIDE -25) 37.5-25 mg per tablet 0.5 tablets, oral, Daily   zolpidem  (AMBIEN ) 5 mg, oral, At  bedtime PRN     Labs Lab Results  Component Value  Date/Time   WBC 5.20 07/01/2023 03:48 PM   HGB 13.0 07/01/2023 03:48 PM   HCT 37.7 07/01/2023 03:48 PM   PLT 247 07/01/2023 03:48 PM   NA 139 10/26/2023 03:39 PM   K 3.8 10/26/2023 03:39 PM   CL 101 10/26/2023 03:39 PM   CO2 31 10/26/2023 03:39 PM    ANIONGAP 7 10/26/2023 03:39 PM   GLUCOSE 100 (H) 10/26/2023 03:39 PM   BUN 18 10/26/2023 03:39 PM   CREATININE 0.91 10/26/2023 03:39 PM   EGFR 65 10/26/2023 03:39 PM   CALCIUM  9.8 10/26/2023 03:39 PM   HDL 77 10/26/2023 03:39 PM   LDLCALC 112 (H) 10/26/2023 03:39 PM   TRIG 180 (H) 10/26/2023 03:39 PM   CHOL 219 (H) 10/26/2023 03:39 PM   HGBA1C 6.0 (H) 10/26/2023 03:39 PM

## 2024-03-01 ENCOUNTER — Other Ambulatory Visit (HOSPITAL_COMMUNITY): Payer: Self-pay | Admitting: Vascular Surgery

## 2024-03-01 ENCOUNTER — Inpatient Hospital Stay
Admission: RE | Admit: 2024-03-01 | Discharge: 2024-03-01 | Disposition: A | Discharge status: Home / Self Care | Payer: Self-pay | Source: Ambulatory Visit | Attending: Vascular Surgery | Admitting: Vascular Surgery

## 2024-03-01 DIAGNOSIS — I6529 Occlusion and stenosis of unspecified carotid artery: Secondary | ICD-10-CM

## 2024-03-28 ENCOUNTER — Ambulatory Visit: Admitting: Surgery

## 2024-03-28 ENCOUNTER — Encounter: Payer: Self-pay | Admitting: Surgery

## 2024-03-28 VITALS — BP 132/77 | HR 91 | Temp 97.8°F | Ht 68.0 in | Wt 179.0 lb

## 2024-03-28 DIAGNOSIS — I6522 Occlusion and stenosis of left carotid artery: Secondary | ICD-10-CM | POA: Insufficient documentation

## 2024-03-28 NOTE — Progress Notes (Signed)
 "                                   [facility]  Patient name: Maria Brennan MRN: [id number] DOB: 1945-07-01 Sex: female   REQUESTING PROVIDER:    Murray Amos   REASON FOR CONSULT:    Carotid disease  HISTORY OF PRESENT ILLNESS:   Maria Brennan is a 79 y.o. female, who is referred for evaluation of carotid disease.  Earlier in December the patient had a fall, which was likely because of a new set of stairs and wearing high heels.  She ended up getting a CT scan that showed calcification within her carotid arteries.  This led to a duplex ultrasound that showed mild stenosis on the left.  She saw Dr. Jacolyn virtually, as her daughter works with him.  She is asymptomatic.  She denies numbness or weakness in either extremity.  She denies slurred speech.  She denies amaurosis fugax   The patient suffers from type 2 diabetes.  She is medically managed for hypertension with an ARB.  She takes a statin for hypercholesterolemia.  She does have lupus.  She is a former smoker  PAST MEDICAL HISTORY    Past Medical History:  Diagnosis Date   Arthritis    oa   Diabetes mellitus without complication (HCC)    Family history of adverse reaction to anesthesia    mother and sister slow to awaken    GERD (gastroesophageal reflux disease)    History of hiatal hernia    Hypertension    Insomnia    Melanoma (HCC)    followed at [facility].   MVP (mitral valve prolapse)    Osteopenia    PONV (postoperative nausea and vomiting) yrs ago   SLE (systemic lupus erythematosus related syndrome) (HCC)      FAMILY HISTORY   Family History  Problem Relation Age of Onset   Heart disease Mother    Diabetes Mother    Migraines Mother    Diabetes Father    Non-Hodgkin's lymphoma Father    Urticaria Father    Urticaria Sister    Breast cancer Sister    Asthma Brother    Urticaria Brother    Angioedema Brother    Bladder Cancer Brother    Eczema Maternal  Grandmother    Allergic rhinitis Daughter    Lupus Daughter    Eczema Daughter    Heart disease Brother     SOCIAL HISTORY:   Social History   Socioeconomic History   Marital status: Married    Spouse name: Not on file   Number of children: Not on file   Years of education: Not on file   Highest education level: Not on file  Occupational History   Not on file  Tobacco Use   Smoking status: Former    Current packs/day: 0.50    Average packs/day: 0.5 packs/day for 53.0 years (26.5 ttl pk-yrs)    Types: Cigarettes   Smokeless tobacco: Never  Vaping Use   Vaping status: Never Used  Substance and Sexual Activity   Alcohol use: No   Drug use: No   Sexual activity: Not on file  Other Topics Concern   Not on file  Social History Narrative   Married, husband is a cytogeneticist and is disabled   2 daughters, one local and one in [place]    4 grandchildren  grown   Retired from sales promotion account executive   Has one dog   Enjoys Advertising Account Planner time with children   Friends with [place]   Social Drivers of Health   Tobacco Use: Medium Risk (03/28/2024)   Patient History    Smoking Tobacco Use: Former    Smokeless Tobacco Use: Never    Passive Exposure: Not on file  Financial Resource Strain: Low Risk (10/21/2021)   Overall Financial Resource Strain (CARDIA)    Difficulty of Paying Living Expenses: Not hard at all  Food Insecurity: Low Risk (01/15/2024)   Received from [facility]   Epic    Within the past 12 months, you worried that your food would run out before you got money to buy more: Never true    Within the past 12 months, the food you bought just didn't last and you didn't have money to get more. : Never true  Transportation Needs: No Transportation Needs (01/15/2024)   Received from [facility]    In the past 12 months, has lack of reliable transportation kept you from medical appointments, meetings, work or from getting things needed  for daily living? : No  Physical Activity: Inactive (10/21/2021)   Exercise Vital Sign    Days of Exercise per Week: 0 days    Minutes of Exercise per Session: 0 min  Stress: No Stress Concern Present (10/21/2021)   [employer] of Occupational Health - Occupational Stress Questionnaire    Feeling of Stress : Not at all  Social Connections: Moderately Integrated (10/21/2021)   Social Connection and Isolation Panel    Frequency of Communication with Friends and Family: More than three times a week    Frequency of Social Gatherings with Friends and Family: More than three times a week    Attends Religious Services: More than 4 times per year    Active Member of [employer] or Organizations: No    Attends Banker Meetings: Never    Marital Status: Married  Catering Manager Violence: Not At Risk (10/21/2021)   Humiliation, Afraid, Rape, and Kick questionnaire    Fear of Current or Ex-Partner: No    Emotionally Abused: No    Physically Abused: No    Sexually Abused: No  Depression (PHQ2-9): Low Risk (10/21/2021)   Depression (PHQ2-9)    PHQ-2 Score: 0  Alcohol Screen: Low Risk (10/21/2021)   Alcohol Screen    Last Alcohol Screening Score (AUDIT): 0  Housing: Low Risk (01/15/2024)   Received from [facility]   Epic    What is your living situation today?: I have a steady place to live    Think about the place you live. Do you have problems with any of the following? Choose all that apply:: None/None on this list  Utilities: Low Risk (01/15/2024)   Received from [facility]   Utilities    In the past 12 months has the electric, gas, oil, or water  company threatened to shut off services in your home? : No  Health Literacy: Not on file    ALLERGIES:    Allergies[1]  CURRENT MEDICATIONS:    Current Outpatient Medications  Medication Sig Dispense Refill   Biotin 10 MG TABS Take by mouth.     Cholecalciferol (VITAMIN D3) 2000 units TABS Take 2,000 Units by mouth daily.      DULoxetine  (CYMBALTA ) 30 MG capsule Take 1 capsule (30 mg total) by mouth daily. 90 capsule 1   DULoxetine  (  CYMBALTA ) 30 MG capsule Take 1 capsule (30 mg total) by mouth daily. 10 capsule 0   furosemide  (LASIX ) 20 MG tablet 1 tablet by mouth once daily as needed for edema 30 tablet 3   GLUCOSAMINE-CHONDROITIN PO Take 1 tablet by mouth 2 (two) times daily.     HYDROcodone -acetaminophen  (NORCO/VICODIN) 5-325 MG tablet Take one or two daily, not to exceed 45/month 45 tablet 0   lidocaine  (LIDODERM ) 5 % Place 1 patch onto the skin daily. Remove & Discard patch within 12 hours or as directed by MD 30 patch 0   losartan  (COZAAR ) 25 MG tablet Take 1 tablet (25 mg total) by mouth daily. 90 tablet 1   magnesium  oxide (MAG-OX) 400 MG tablet Take 400 mg by mouth daily.     MAGNESIUM  PO Take by mouth.     omeprazole  (PRILOSEC) 40 MG capsule Take 1 capsule (40 mg total) by mouth every evening. 90 capsule 1   rosuvastatin  (CRESTOR ) 20 MG tablet Take 1 tablet (20 mg total) by mouth daily. 90 tablet 1   Semaglutide , 1 MG/DOSE, (OZEMPIC , 1 MG/DOSE,) 4 MG/3ML SOPN Ozempic  1 mg once a week 3 mL 1   sulfamethoxazole -trimethoprim  (BACTRIM  DS) 800-160 MG tablet Take 1 tablet by mouth 2 (two) times daily. 14 tablet 0   triamterene -hydrochlorothiazide  (MAXZIDE -25) 37.5-25 MG tablet Take 0.5 tablets by mouth daily. 45 tablet 1   Turmeric 500 MG TABS Take by mouth.     zolpidem  (AMBIEN ) 5 MG tablet Take 1 tablet (5 mg total) by mouth at bedtime. 30 tablet 0   No current facility-administered medications for this visit.    REVIEW OF SYSTEMS:   [X]  denotes positive finding, [ ]  denotes negative finding Cardiac  Comments:  Chest pain or chest pressure:    Shortness of breath upon exertion:    Short of breath when lying flat:    Irregular heart rhythm:        Vascular    Pain in calf, thigh, or hip brought on by ambulation:    Pain in feet at night that wakes you up from your sleep:     Blood clot in your  veins:    Leg swelling:         Pulmonary    Oxygen at home:    Productive cough:     Wheezing:         Neurologic    Sudden weakness in arms or legs:     Sudden numbness in arms or legs:     Sudden onset of difficulty speaking or slurred speech:    Temporary loss of vision in one eye:     Problems with dizziness:         Gastrointestinal    Blood in stool:      Vomited blood:         Genitourinary    Burning when urinating:     Blood in urine:        Psychiatric    Major depression:         Hematologic    Bleeding problems:    Problems with blood clotting too easily:        Skin    Rashes or ulcers:        Constitutional    Fever or chills:     PHYSICAL EXAM:   Vitals:   03/28/24 1512 03/28/24 1514  BP: 128/78 132/77  Pulse: 91   Temp: 97.8 F (36.6 C)   SpO2: 95%  Weight: 179 lb (81.2 kg)   Height: 5' 8 (1.727 m)     GENERAL: The patient is a well-nourished female, in no acute distress. The vital signs are documented above. CARDIAC: There is a regular rate and rhythm.  VASCULAR: Palpable pedal pulses bilaterally PULMONARY: Nonlabored respirations ABDOMEN: Soft and non-tender.  No pulsatile mass  MUSCULOSKELETAL: There are no major deformities or cyanosis. NEUROLOGIC: No focal weakness or paresthesias are detected. SKIN: There are no ulcers or rashes noted. PSYCHIATRIC: The patient has a normal affect.  STUDIES:   I have reviewed the carotid ultrasound from [facility] with the following findings: 1. No evidence of hemodynamically significant stenosis in either carotid system. 2. Antegrade flow within both vertebral arteries. 3. Mildly elevated left ICA velocity without other parameters of significant stenosis. 4. Moderate to severe left CCA/ECA stenosis.   Max ICA peak systolic velocity was 219 cm/s  ASSESSMENT and PLAN   Asymptomatic carotid stenosis: The patient has mild stenosis on the left which is not causing her symptoms and did not  contribute to her fall.  I would recommend surveillance imaging in 1 year  She will be referred to the Pharm.D. clinic for medication optimization   Malvina New, IV, MD, FACS [facility] [phone] Pager [phone]     [1]  Allergies Allergen Reactions   Meloxicam Other (See Comments)    meloxicam   Pioglitazone Other (See Comments)    pioglitazone   Codeine Itching and Nausea Only   "

## 2024-04-05 ENCOUNTER — Ambulatory Visit: Admitting: Student-PharmD
